# Patient Record
Sex: Female | Born: 1980 | Race: White | Hispanic: No | Marital: Married | Smoking: Never smoker
Health system: Southern US, Community
[De-identification: ages and names within clinical notes are randomized; demographics above are authoritative.]

## PROBLEM LIST (undated history)

## (undated) ENCOUNTER — Inpatient Hospital Stay (HOSPITAL_COMMUNITY): Payer: Self-pay

## (undated) DIAGNOSIS — F32A Depression, unspecified: Secondary | ICD-10-CM

## (undated) DIAGNOSIS — F329 Major depressive disorder, single episode, unspecified: Secondary | ICD-10-CM

## (undated) HISTORY — PX: NO PAST SURGERIES: SHX2092

---
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
## 1999-11-26 ENCOUNTER — Other Ambulatory Visit: Admission: RE | Admit: 1999-11-26 | Discharge: 1999-11-26 | Payer: Self-pay | Admitting: Obstetrics and Gynecology

## 2001-01-06 ENCOUNTER — Other Ambulatory Visit: Admission: RE | Admit: 2001-01-06 | Discharge: 2001-01-06 | Payer: Self-pay | Admitting: Obstetrics and Gynecology

## 2001-07-03 ENCOUNTER — Inpatient Hospital Stay (HOSPITAL_COMMUNITY): Admission: AD | Admit: 2001-07-03 | Discharge: 2001-07-03 | Payer: Self-pay | Admitting: Obstetrics and Gynecology

## 2001-11-17 ENCOUNTER — Ambulatory Visit (HOSPITAL_COMMUNITY): Admission: RE | Admit: 2001-11-17 | Discharge: 2001-11-17 | Payer: Self-pay | Admitting: Obstetrics and Gynecology

## 2002-01-18 ENCOUNTER — Inpatient Hospital Stay (HOSPITAL_COMMUNITY): Admission: AD | Admit: 2002-01-18 | Discharge: 2002-01-20 | Payer: Self-pay | Admitting: Obstetrics and Gynecology

## 2002-01-22 ENCOUNTER — Encounter: Admission: RE | Admit: 2002-01-22 | Discharge: 2002-02-21 | Payer: Self-pay | Admitting: Obstetrics and Gynecology

## 2002-02-15 ENCOUNTER — Other Ambulatory Visit: Admission: RE | Admit: 2002-02-15 | Discharge: 2002-02-15 | Payer: Self-pay | Admitting: Obstetrics and Gynecology

## 2002-03-24 ENCOUNTER — Encounter: Admission: RE | Admit: 2002-03-24 | Discharge: 2002-04-23 | Payer: Self-pay | Admitting: Obstetrics and Gynecology

## 2002-04-24 ENCOUNTER — Encounter: Admission: RE | Admit: 2002-04-24 | Discharge: 2002-05-24 | Payer: Self-pay | Admitting: Obstetrics and Gynecology

## 2003-05-16 ENCOUNTER — Other Ambulatory Visit: Admission: RE | Admit: 2003-05-16 | Discharge: 2003-05-16 | Payer: Self-pay | Admitting: Obstetrics and Gynecology

## 2004-05-25 ENCOUNTER — Ambulatory Visit: Payer: Self-pay | Admitting: Family Medicine

## 2004-09-01 ENCOUNTER — Other Ambulatory Visit: Admission: RE | Admit: 2004-09-01 | Discharge: 2004-09-01 | Payer: Self-pay | Admitting: Obstetrics and Gynecology

## 2004-11-12 ENCOUNTER — Inpatient Hospital Stay (HOSPITAL_COMMUNITY): Admission: AD | Admit: 2004-11-12 | Discharge: 2004-11-12 | Payer: Self-pay | Admitting: Obstetrics and Gynecology

## 2005-03-25 ENCOUNTER — Inpatient Hospital Stay (HOSPITAL_COMMUNITY): Admission: AD | Admit: 2005-03-25 | Discharge: 2005-03-25 | Payer: Self-pay | Admitting: Obstetrics and Gynecology

## 2005-06-17 ENCOUNTER — Inpatient Hospital Stay (HOSPITAL_COMMUNITY): Admission: AD | Admit: 2005-06-17 | Discharge: 2005-06-19 | Payer: Self-pay | Admitting: Obstetrics & Gynecology

## 2007-06-12 ENCOUNTER — Encounter: Admission: RE | Admit: 2007-06-12 | Discharge: 2007-06-12 | Payer: Self-pay | Admitting: Obstetrics and Gynecology

## 2008-10-30 ENCOUNTER — Encounter: Payer: Self-pay | Admitting: Family Medicine

## 2008-12-23 ENCOUNTER — Encounter: Admission: RE | Admit: 2008-12-23 | Discharge: 2008-12-23 | Payer: Self-pay | Admitting: Family Medicine

## 2009-08-25 IMAGING — CT CT HEAD W/O CM
2 series · 16 of 30 positions shown, 20 images · non-contrast
Comparison: NONE

CLINICAL DATA: Headache. 

UNENHANCED CT OF THE HEAD
TECHNIQUE: Axial 5 millimeter thick slices were obtained through 
the posterior fossa and 5 millimeter thick slices were obtained 
through the remaining portion of the head without intravenous 
contrast.

[Series 2: without contrast · axial · non-contrast · 0.49mm/px · z∈[-457,-307]mm · 13 of 36 slices shown, 17 images]
[im 3/36  brain]
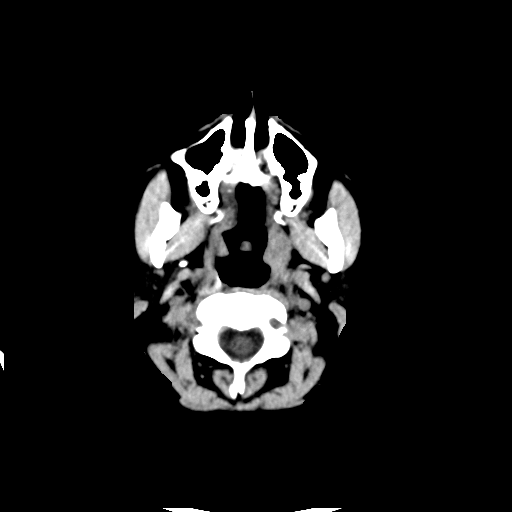
[im 3/36  bone]
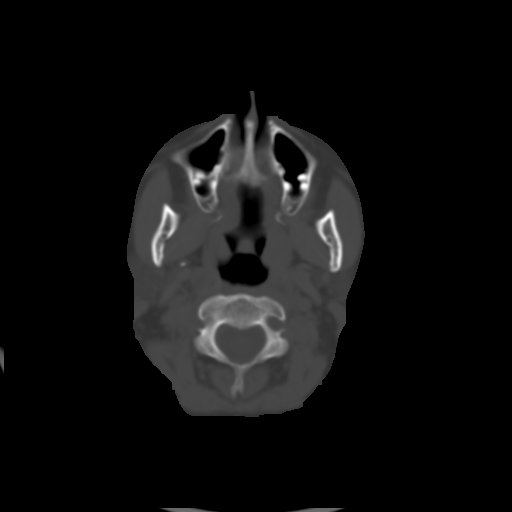
[im 6/36  brain]
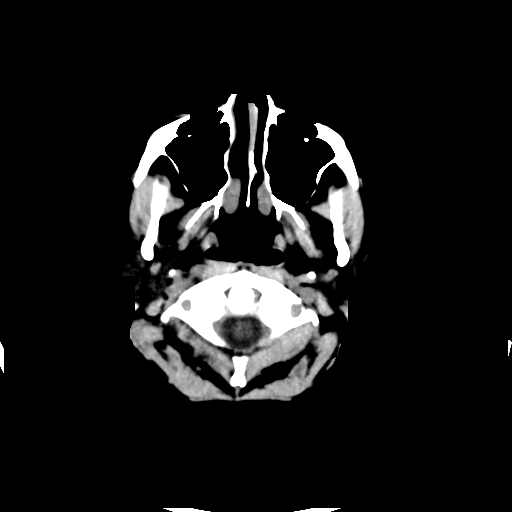
[im 8/36  brain]
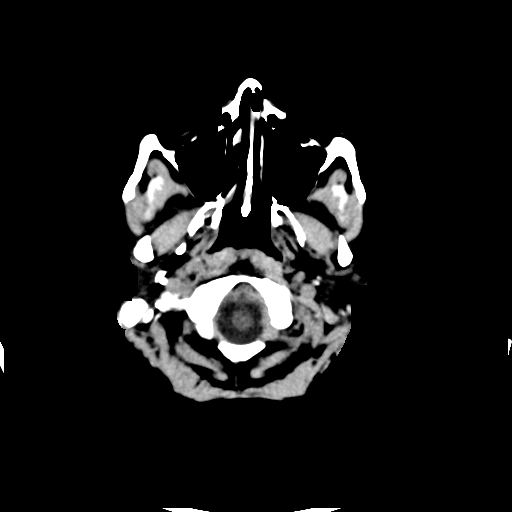
[im 11/36  brain]
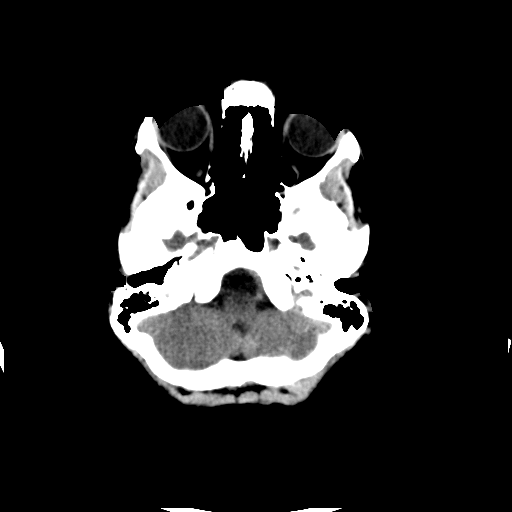
[im 13/36  brain]
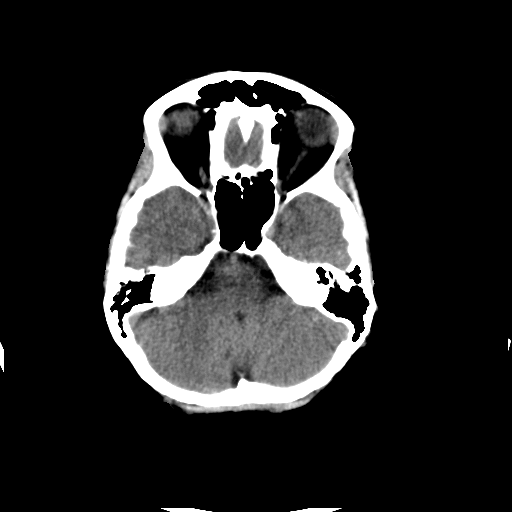
[im 13/36  bone]
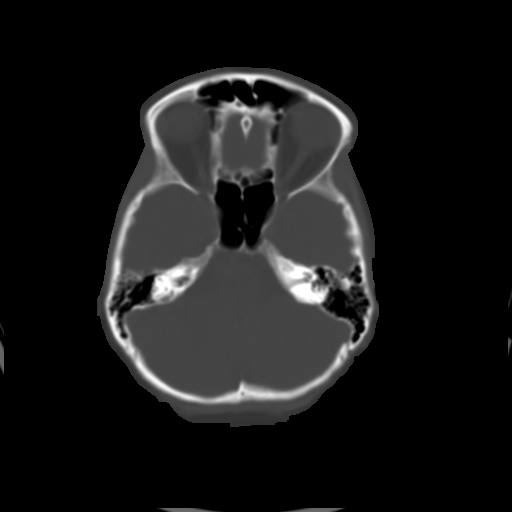
[im 16/36  brain]
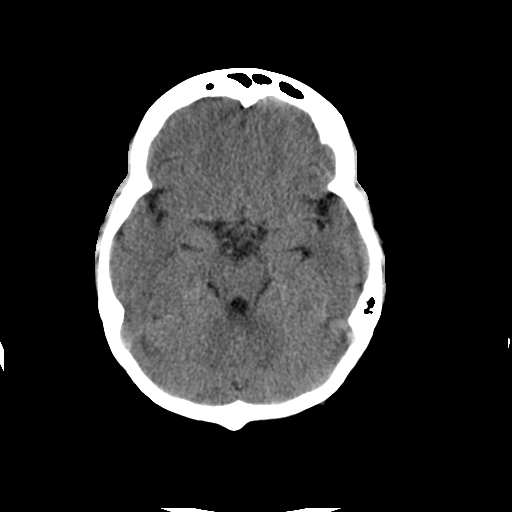
[im 18/36  brain]
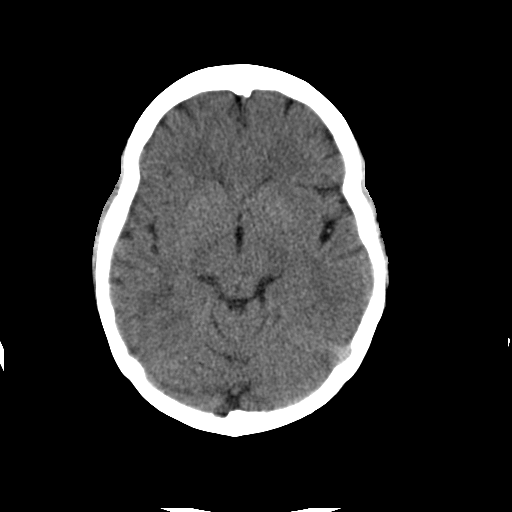
[im 21/36  brain]
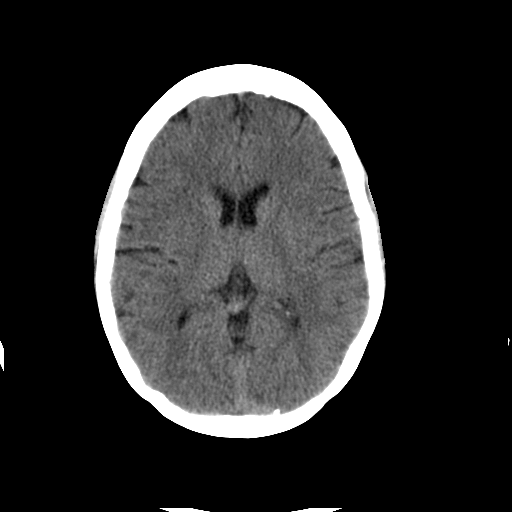
[im 23/36  brain]
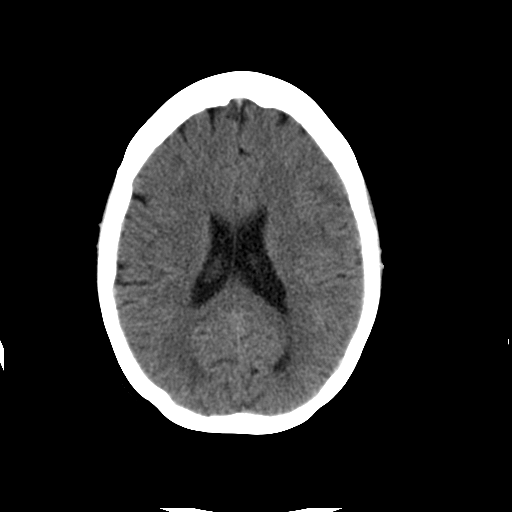
[im 23/36  bone]
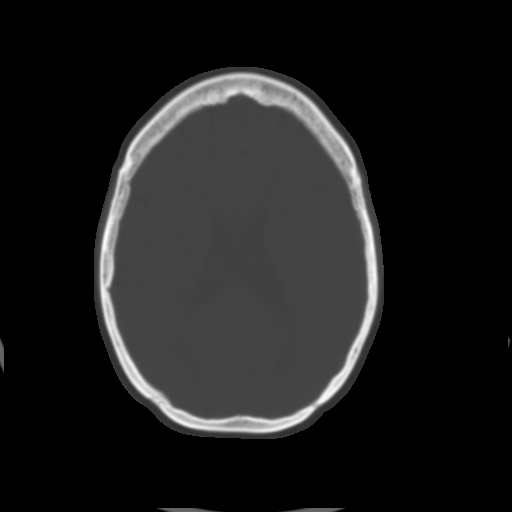
[im 26/36  brain]
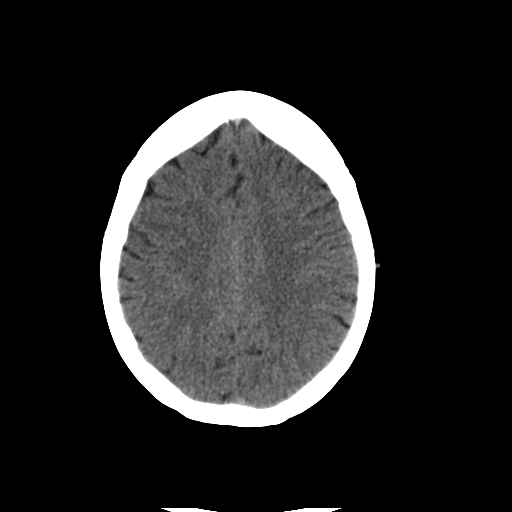
[im 28/36  brain]
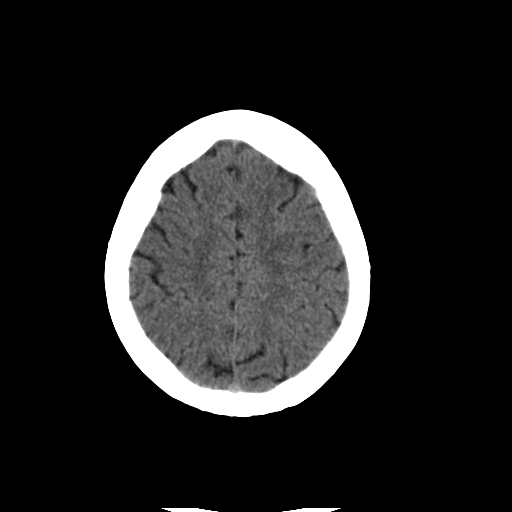
[im 31/36  brain]
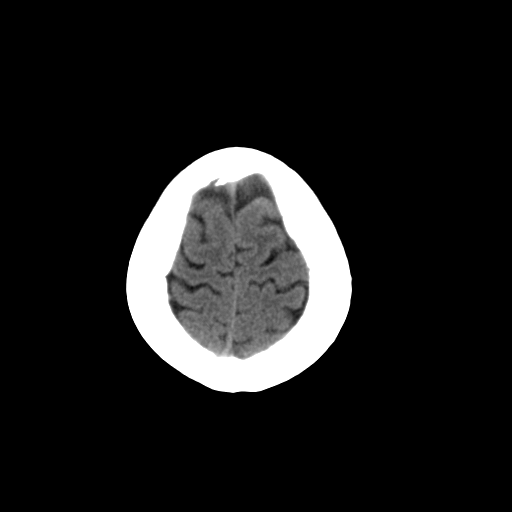
[im 33/36  brain]
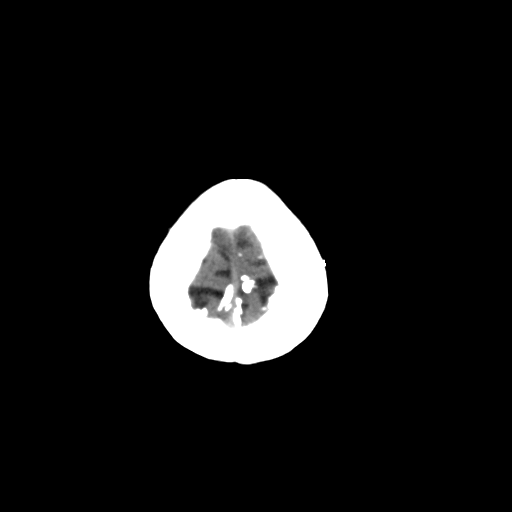
[im 33/36  bone]
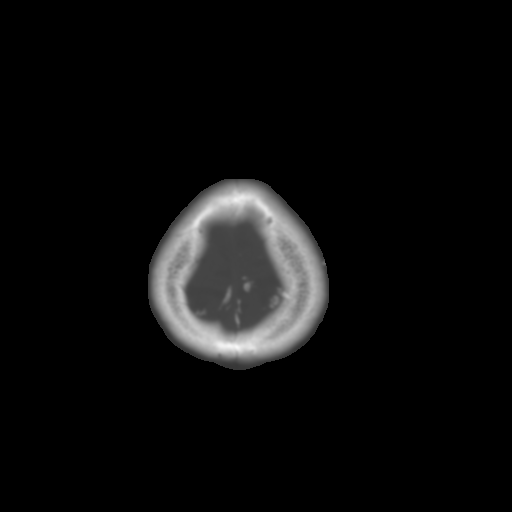

[Series 3: bone windows · axial · 0.49mm/px · z∈[-457,-407]mm · 3 of 36 slices shown]
[im 3/36  bone]
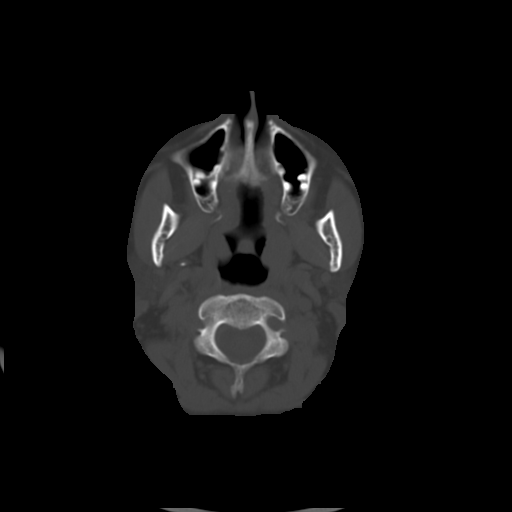
[im 8/36  bone]
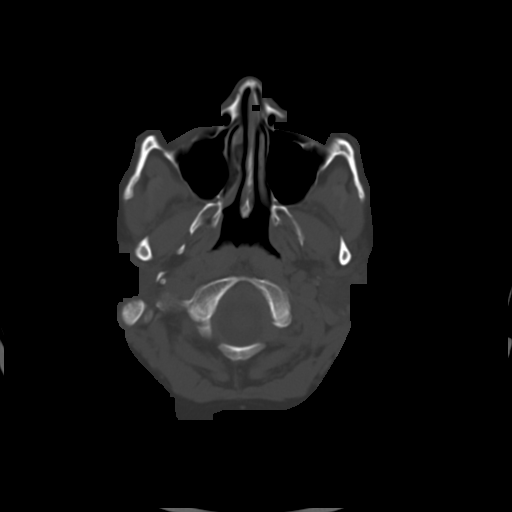
[im 13/36  bone]
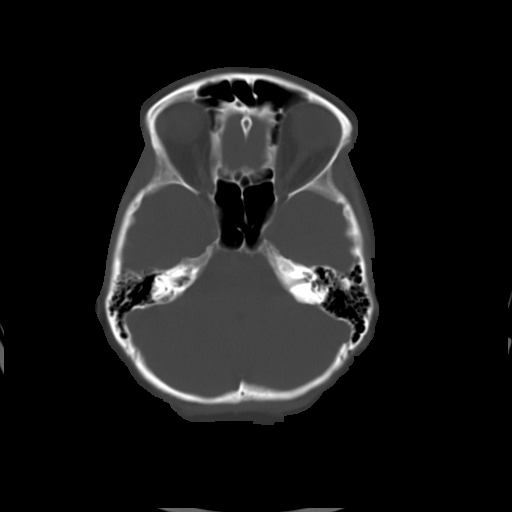

[16 of 30 positions shown; findings below may reference images not displayed]

FINDINGS: No prior. The visualized portions of the paranasal 
sinuses, orbits, and mastoids are unremarkable in appearance. The 
fourth, third and both lateral ventricles are identified.  There 
is no evidence of midline shift or mass effect infratentorially or 
supratentorially. No areas of abnormal radiolucency or 
radiodensity are identified. There is no evidence of subarachnoid 
or intracerebral hemorrhage.  There is no evidence of subdural or 
epidural hematoma. The calvarium is intact.
IMPRESSION: Normal unenhanced CT of the head. Zinuta Bau 
02/21/2008 Dict Date: 02/20/2008  Trans Date: 02/21/2008 [id number]  
[REDACTED]

## 2010-06-14 ENCOUNTER — Emergency Department (HOSPITAL_COMMUNITY)
Admission: EM | Admit: 2010-06-14 | Discharge: 2010-06-14 | Disposition: A | Discharge status: Home / Self Care | Payer: BLUE CROSS/BLUE SHIELD | Attending: Emergency Medicine | Admitting: Emergency Medicine

## 2010-06-14 DIAGNOSIS — R197 Diarrhea, unspecified: Secondary | ICD-10-CM | POA: Insufficient documentation

## 2010-06-14 DIAGNOSIS — IMO0001 Reserved for inherently not codable concepts without codable children: Secondary | ICD-10-CM | POA: Insufficient documentation

## 2010-06-14 DIAGNOSIS — R209 Unspecified disturbances of skin sensation: Secondary | ICD-10-CM | POA: Insufficient documentation

## 2010-06-14 DIAGNOSIS — B9789 Other viral agents as the cause of diseases classified elsewhere: Secondary | ICD-10-CM | POA: Insufficient documentation

## 2010-06-14 DIAGNOSIS — R112 Nausea with vomiting, unspecified: Secondary | ICD-10-CM | POA: Insufficient documentation

## 2010-06-14 LAB — POCT I-STAT, CHEM 8
BUN: 10 mg/dL (ref 6–23)
Calcium, Ion: 1.12 mmol/L (ref 1.12–1.32)
Creatinine, Ser: 0.8 mg/dL (ref 0.4–1.2)
Hemoglobin: 15.6 g/dL — ABNORMAL HIGH (ref 12.0–15.0)
Potassium: 3.5 mEq/L (ref 3.5–5.1)
Sodium: 141 mEq/L (ref 135–145)

## 2010-06-28 IMAGING — US US TRANSVAGINAL NON-OB
1 series · 14 of 25 positions shown · non-contrast
Comparison: Abdominal ultrasound same date.

CLINICAL DATA: Pelvic pain.

TRANSABDOMINAL AND TRANSVAGINAL ULTRASOUND OF PELVIS
TECHNIQUE: Both transabdominal and transvaginal ultrasound
examinations of the pelvis were performed including evaluation of
the uterus, ovaries, adnexal regions, and pelvic cul-de-sac.

[Series 1: us transvaginal non-ob · 0.22mm/px · 14 of 74 slices shown]
[im 1/74]
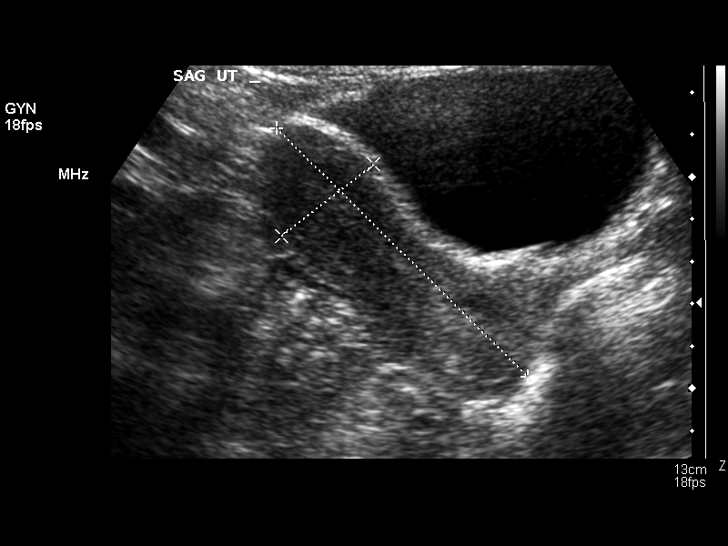
[im 7/74]
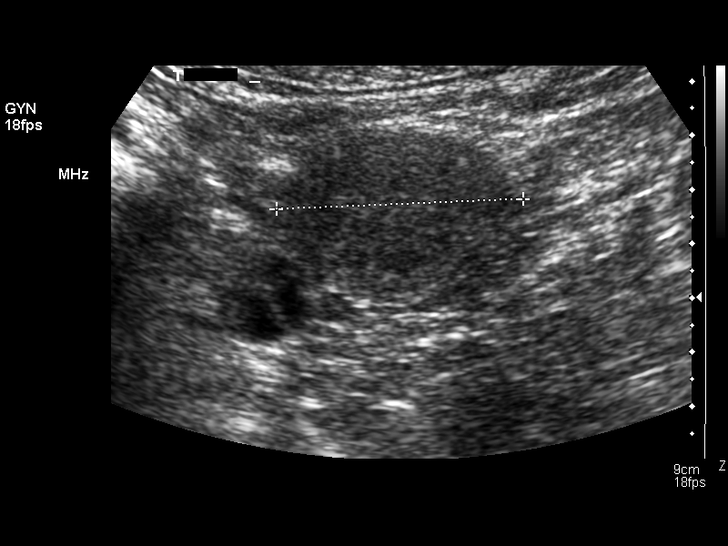
[im 13/74]
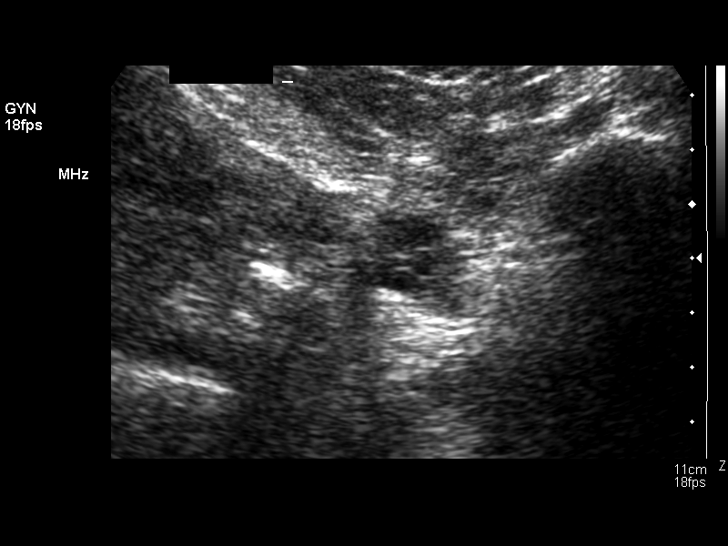
[im 19/74]
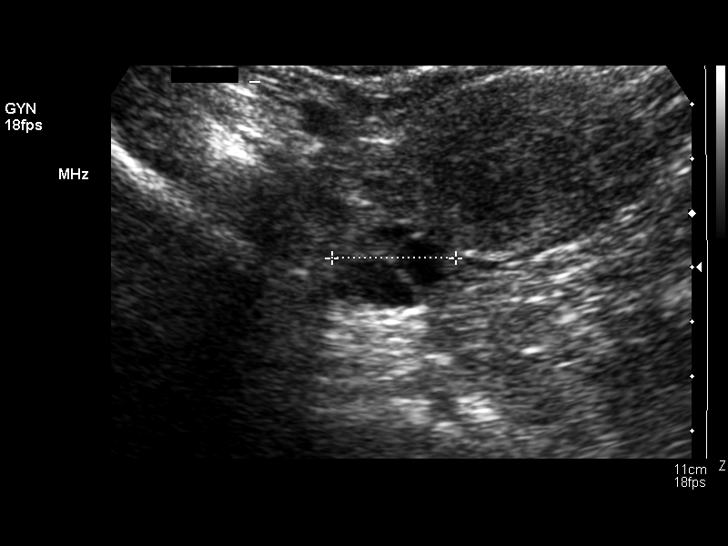
[im 25/74]
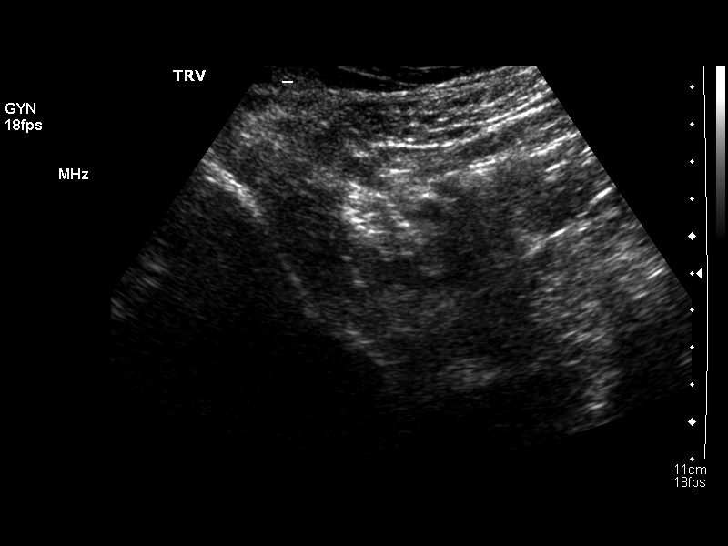
[im 28/74]
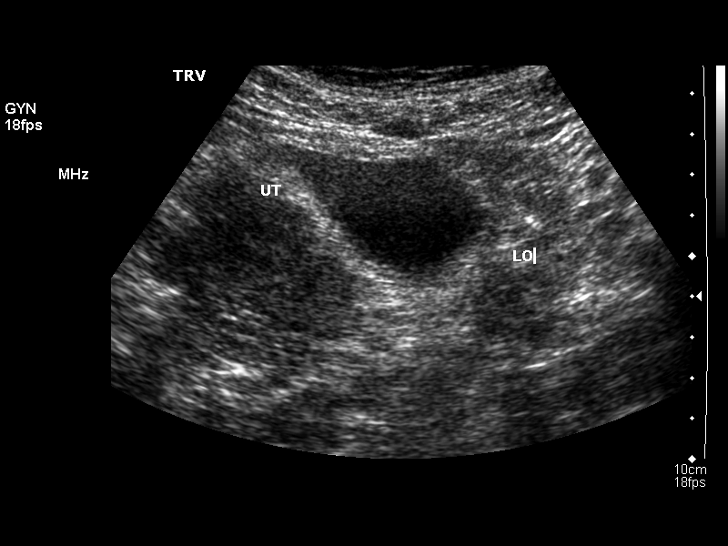
[im 34/74]
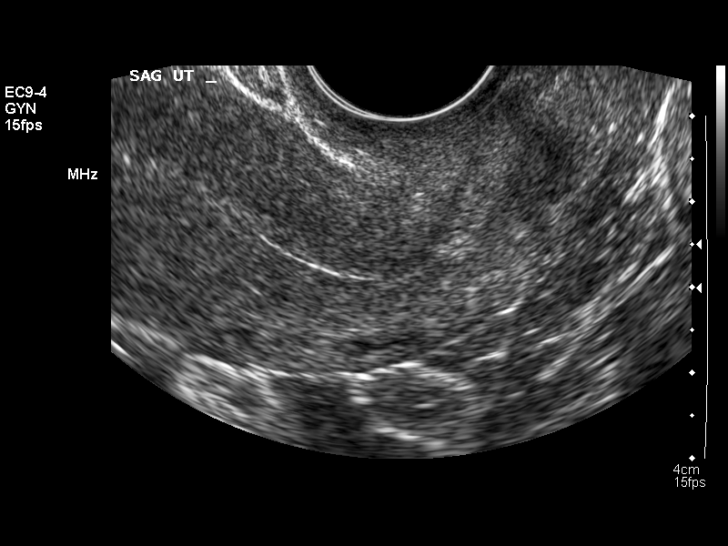
[im 40/74]
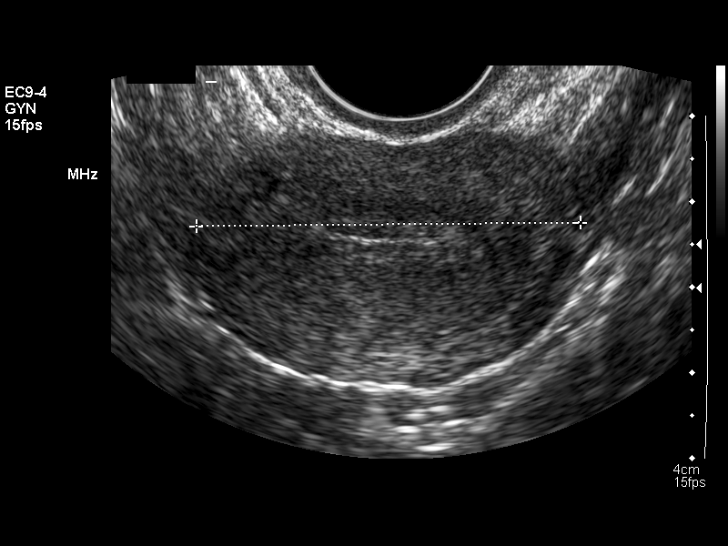
[im 46/74]
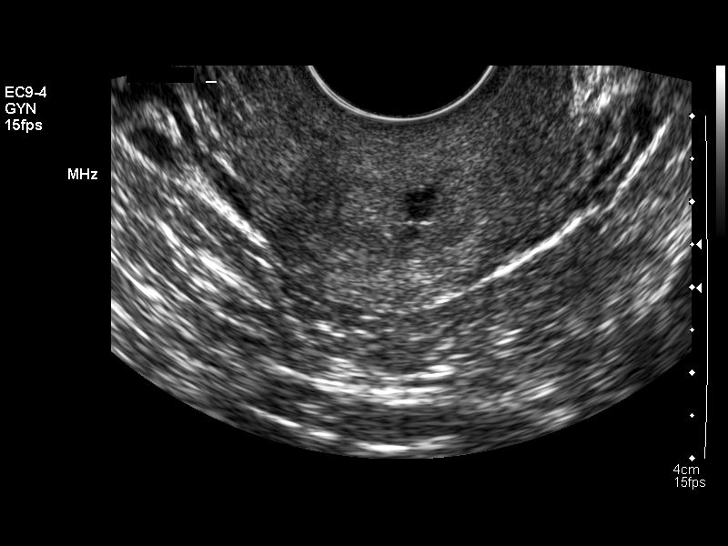
[im 49/74]
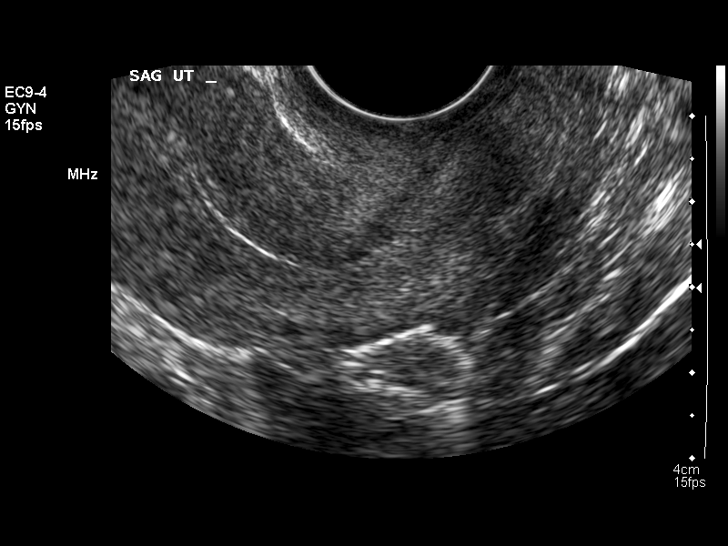
[im 55/74]
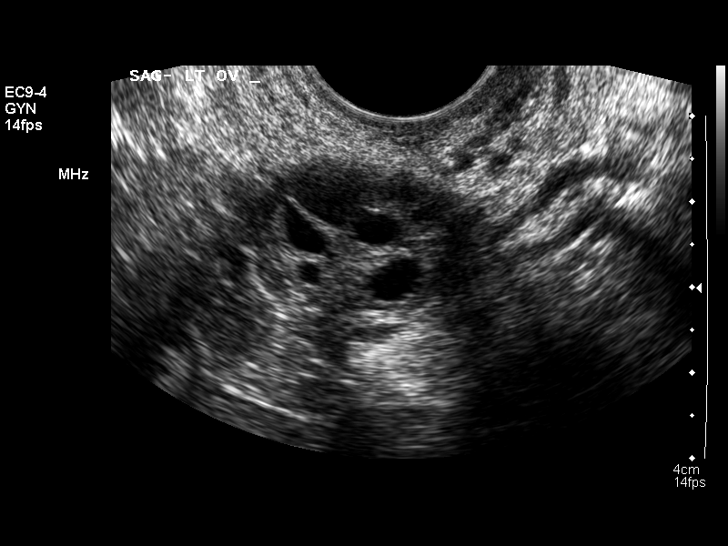
[im 61/74]
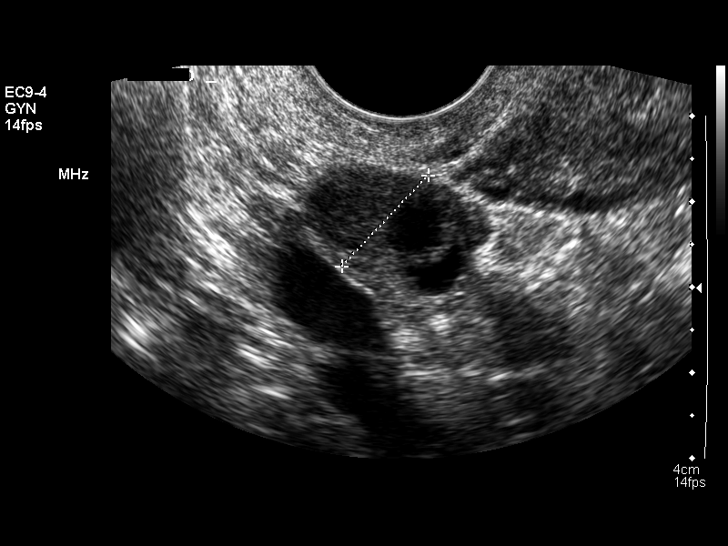
[im 67/74]
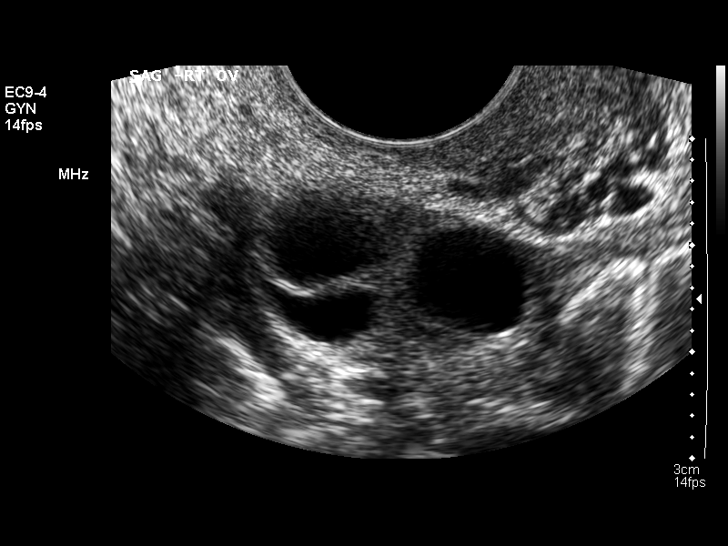
[im 74/74]
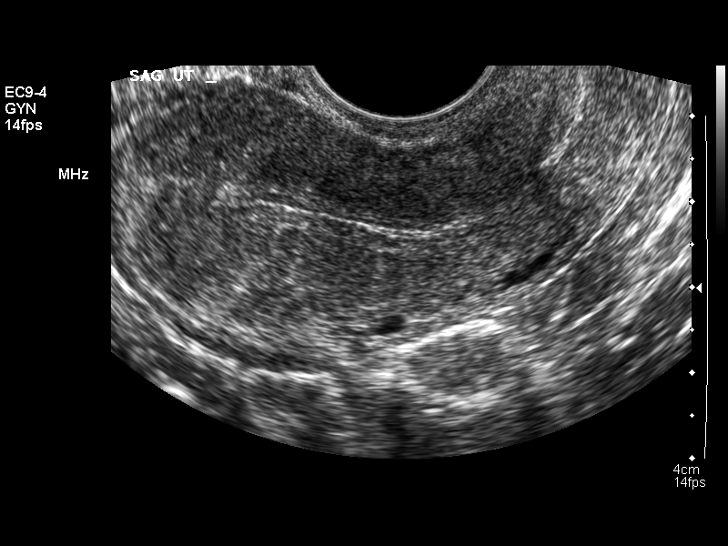

[14 of 25 positions shown; findings below may reference images not displayed]

FINDINGS: Uterus 7.9 x 3.4 x 4.5 cm.  Small Nabothian cysts.

Endometrium normal, 6 mm

Right Ovary 3.3 x 1.8 x 1.5 cm.  Mildly prominent ovarian follicles
which measure up to 1.1 cm.

Left Ovary 2.6 x 1.5 x 1.7 cm.  Normal left ovarian morphology.

Other Findings:  No significant free fluid.
IMPRESSION: 1.  Mildly prominent right ovarian follicles, felt to be within
normal limits.
2.  Otherwise, normal pelvic ultrasound for age.

## 2012-10-27 ENCOUNTER — Encounter: Payer: Self-pay | Admitting: Family Medicine

## 2012-10-27 ENCOUNTER — Ambulatory Visit (INDEPENDENT_AMBULATORY_CARE_PROVIDER_SITE_OTHER): Payer: BLUE CROSS/BLUE SHIELD | Admitting: Family Medicine

## 2012-10-27 VITALS — BP 100/76 | HR 68 | Temp 97.6°F | Resp 12 | Ht 63.0 in | Wt 138.0 lb

## 2012-10-27 DIAGNOSIS — Z0000 Encounter for general adult medical examination without abnormal findings: Secondary | ICD-10-CM

## 2012-10-27 MED ORDER — DIAZEPAM 10 MG PO TABS: 10.0000 mg | ORAL_TABLET | Freq: Three times a day (TID) | ORAL | Status: DC | PRN

## 2012-10-27 NOTE — Progress Notes (Signed)
Subjective:    Patient ID: Karla Mcintosh, female    DOB: May 19, 1980, 32 y.o.   MRN: [id number]  HPI Very pleasant 32 year old female here today for a physical. She sees a gynecologist who performs her breast exams, Pap smears, pelvic exam. This is scheduled for next month. She is going to be running a half marathon in [place] in September.  She is afraid of flying and is requesting medicine to help her relax on airplane. Otherwise she has no specific medical complaints. Previously she had a mildly elevated white blood cell count last year and we need to follow that up. She denies any fevers chills myalgias or rigors. No past medical history on file. No current outpatient prescriptions on file prior to visit.   No current facility-administered medications on file prior to visit.   No Known Allergies History   Social History  . Marital Status: Married    Spouse Name: N/A    Number of Children: N/A  . Years of Education: N/A   Occupational History  . Not on file.   Social History Main Topics  . Smoking status: Never Smoker   . Smokeless tobacco: Never Used  . Alcohol Use: Yes     Comment: Very rare  . Drug Use: No  . Sexual Activity: Yes     Comment: Married to [place], 2 kids, running 1/2 marathon in [place] Sept/14   Other Topics Concern  . Not on file   Social History Narrative  . No narrative on file   Family History  Problem Relation Age of Onset  . Diabetes Father   . Cancer Maternal Grandmother     thyroid cancer  . Arthritis Paternal Grandmother     rheumatoid  . Heart disease Paternal Grandfather       Review of Systems  All other systems reviewed and are negative.       Objective:   Physical Exam  Vitals reviewed. Constitutional: She is oriented to person, place, and time. She appears well-developed and well-nourished. No distress.  HENT:  Head: Normocephalic and atraumatic.  Right Ear: External ear normal.  Left Ear: External ear normal.   Nose: Nose normal.  Mouth/Throat: Oropharynx is clear and moist. No oropharyngeal exudate.  Eyes: Conjunctivae and EOM are normal. Pupils are equal, round, and reactive to light. Right eye exhibits no discharge. Left eye exhibits no discharge. No scleral icterus.  Neck: Normal range of motion. Neck supple. No JVD present. No tracheal deviation present. No thyromegaly present.  Cardiovascular: Normal rate, regular rhythm, normal heart sounds and intact distal pulses.  Exam reveals no gallop and no friction rub.   No murmur heard. Pulmonary/Chest: Effort normal and breath sounds normal. No stridor. No respiratory distress. She has no wheezes. She has no rales. She exhibits no tenderness.  Abdominal: Soft. Bowel sounds are normal. She exhibits no distension and no mass. There is no tenderness. There is no rebound and no guarding.  Musculoskeletal: Normal range of motion. She exhibits no edema and no tenderness.  Lymphadenopathy:    She has no cervical adenopathy.  Neurological: She is alert and oriented to person, place, and time. She has normal reflexes. She displays normal reflexes. No cranial nerve deficit. She exhibits normal muscle tone. Coordination normal.  Skin: Skin is warm. No rash noted. She is not diaphoretic. No erythema. No pallor.  Psychiatric: She has a normal mood and affect. Her behavior is normal. Judgment and thought content normal.  Assessment & Plan:  1. Routine general medical examination at a health care facility Her physical exam today is completely normal. I recommended she return fasting next week so that we can check a CBC, CMP, lipid panel, and thyroid. I recommended an annual flu shot in the fall. Her TdaP shot was given in 2012 and is up-to-date.  I gave the patient diazepam 10 mg tablets one half of the tablet 30 minutes prior to flying. She could take the other half hours into the flight if necessary. I warned her about sedation and side effects of  diazepam. - CBC with Differential; Future - COMPLETE METABOLIC PANEL WITH GFR; Future - Lipid panel; Future - TSH; Future

## 2013-03-12 ENCOUNTER — Telehealth: Payer: Self-pay | Admitting: *Deleted

## 2013-03-12 MED ORDER — CIPROFLOXACIN HCL 500 MG PO TABS: 500.0000 mg | ORAL_TABLET | Freq: Two times a day (BID) | ORAL | Status: DC

## 2013-03-12 NOTE — Telephone Encounter (Signed)
Med refilled and pt aware  

## 2013-03-12 NOTE — Telephone Encounter (Signed)
[facility] not [facility] but she can have cipro 500 bid for 5 days.

## 2013-03-12 NOTE — Telephone Encounter (Signed)
Pt states [facility] went to [facility] with [facility]TI [facility], it was confirmed UTI, was given antibiotic for 3 days on last dose today, states she is still having symptoms and wants to know if you can give another round of antibiotic, pt could not give me name of medication but states that [facility] was going to send something to you by fax.

## 2013-03-13 ENCOUNTER — Telehealth: Payer: Self-pay | Admitting: *Deleted

## 2013-03-13 MED ORDER — SULFAMETHOXAZOLE-TMP DS 800-160 MG PO TABS: 1.0000 | ORAL_TABLET | Freq: Two times a day (BID) | ORAL | Status: DC

## 2013-03-13 NOTE — Telephone Encounter (Signed)
Was given cipro last nite pt read side effects and is concerned, she is running in a marathon this week and she read that cipro can cause tendon rupture and she feels she should not take this medication, is there something else she can take in its place.

## 2013-03-13 NOTE — Telephone Encounter (Signed)
Message copied by Donne AnonPLUMMER, KIM M on Tue Mar 13, 2013  5:16 PM ------      Message from: Eustaquio MaizeMEDLIN, SARAH J      Created: Tue Mar 13, 2013  2:07 PM      Contact: [phone]       lEFT MESSAGE FOR NURSE TO CALL HER ------

## 2013-03-13 NOTE — Telephone Encounter (Signed)
I think she would be fine on cipro but she can switch to bactrim ds pobid for 3 days if she wishes.

## 2013-03-13 NOTE — Telephone Encounter (Signed)
Pt called and new RX sent to pharmacy

## 2013-05-31 ENCOUNTER — Encounter: Payer: Self-pay | Admitting: Family Medicine

## 2013-05-31 ENCOUNTER — Ambulatory Visit (INDEPENDENT_AMBULATORY_CARE_PROVIDER_SITE_OTHER): Payer: BC Managed Care – PPO | Admitting: Family Medicine

## 2013-05-31 VITALS — BP 110/68 | HR 60 | Temp 97.4°F | Resp 16 | Ht 63.0 in | Wt 147.0 lb

## 2013-05-31 DIAGNOSIS — E669 Obesity, unspecified: Secondary | ICD-10-CM

## 2013-05-31 MED ORDER — PHENTERMINE HCL 37.5 MG PO CAPS: 37.5000 mg | ORAL_CAPSULE | ORAL | Status: DC

## 2013-05-31 NOTE — Progress Notes (Signed)
Subjective:    Patient ID: Karla Mcintosh, female    DOB: June 03, 1980, 33 y.o.   MRN: [id number]  HPI Patient is a very pleasant 33 year old white female who is concerned about her inability to lose weight. She weighs 147 pounds and is 5 foot 3 inches tall. This gives her a BMI of 26 which is slightly overweight. Her goal rate is 130 pounds. She is running approximately 30 minutes to an hour 5 days a week. She is very satisfied with her strength in her legs and of her legs. She is concerned because she is retaining weight in her mid section in her arms and in her chest. 2 placentae she has excessive cellulite in her triceps area, and her chest and upper back, and especially her midsection.  At present she is not performing any core exercises to address these areas. She is also eating a diet low in carbohydrates high in protein and vegetables. She denies any fatigue, abnormal hair growth, heat or cold intolerance, palpitations, diarrhea, libido, or other signs of endocrinologic problems. No past medical history on file. No current outpatient prescriptions on file prior to visit.   No current facility-administered medications on file prior to visit.   No Known Allergies History   Social History  . Marital Status: Married    Spouse Name: N/A    Number of Children: N/A  . Years of Education: N/A   Occupational History  . Not on file.   Social History Main Topics  . Smoking status: Never Smoker   . Smokeless tobacco: Never Used  . Alcohol Use: Yes     Comment: Very rare  . Drug Use: No  . Sexual Activity: Yes     Comment: Married to [place], 2 kids, running 1/2 marathon in [place] Sept/14   Other Topics Concern  . Not on file   Social History Narrative  . No narrative on file      Review of Systems  All other systems reviewed and are negative.       Objective:   Physical Exam  Vitals reviewed. Constitutional: She appears well-developed and well-nourished.  Neck: Neck  supple. No thyromegaly present.  Cardiovascular: Normal rate, regular rhythm and normal heart sounds.   No murmur heard. Pulmonary/Chest: Effort normal and breath sounds normal. No respiratory distress. She has no wheezes. She has no rales.  Musculoskeletal: She exhibits no edema.  Lymphadenopathy:    She has no cervical adenopathy.   patient does have extra fat in her midsection and in the backs of her arms.        Assessment & Plan:  1. Obesity, unspecified Patient does have elevated BMI. However I do not believe she is obese. Counseled patient against weight loss drugs. I do believe it is reasonable to work towards a target weight of 130-135 pounds. However I explained to the patient that the majority of our focus needs to be on exercises that address her problem areas. I recommended high repetition activities at low weight such as pushups, crunches, sit ups, curls.  I recommended that she spend less time exercising running and more on cardio and upper body strengthening. I will give the patient one-month supply of Adipex 37.5 mg by mouth every morning to help control some of her cravings and help reduce calorie intake. Also recommended switching to a high-protein diet and low carbohydrate diet. Recheck in one month. - phentermine 37.5 MG capsule; Take 1 capsule (37.5 mg total) by mouth every morning.  Dispense: 30 capsule; Refill: 0

## 2013-08-01 ENCOUNTER — Telehealth: Payer: Self-pay | Admitting: Family Medicine

## 2013-08-01 DIAGNOSIS — E669 Obesity, unspecified: Secondary | ICD-10-CM

## 2013-08-01 NOTE — Telephone Encounter (Signed)
Message copied by Ricard Dillon on Wed Aug 01, 2013  2:28 PM ------      Message from: Karla Mcintosh      Created: Tue Jul 31, 2013  4:38 PM       Patient would like to speak with you left message and did not specify the reason [phone] ------

## 2013-08-01 NOTE — Telephone Encounter (Signed)
Pt would like one more month of Phentermine as she is doing well on it and would like it for just one more month.

## 2013-08-02 MED ORDER — PHENTERMINE HCL 37.5 MG PO CAPS: 37.5000 mg | ORAL_CAPSULE | ORAL | Status: DC

## 2013-08-02 NOTE — Telephone Encounter (Signed)
Prescription printed and patient made aware.   Prescription faxed.

## 2013-08-02 NOTE — Telephone Encounter (Signed)
Ok 37.5 poqam 30 0 rf

## 2013-11-07 ENCOUNTER — Telehealth: Payer: Self-pay | Admitting: Family Medicine

## 2013-11-07 NOTE — Telephone Encounter (Signed)
[phone]   Pt is wanting to know when her last TDap was and how long are they good for??

## 2013-11-08 NOTE — Telephone Encounter (Signed)
Pt aware of date and length of time for Tetanus

## 2014-03-08 NOTE — L&D Delivery Note (Addendum)
Delivery Note  First Stage: Labor onset: 0300 Augmentation: AROM, Pitocin Analgesia /Anesthesia intrapartum: epidural AROM at 1132  Second Stage: Complete dilation at 1723 Onset of pushing at 1723 FHR second stage 170-180s, variables w/pushing, Cat II Maternal temp 101, Tylenol 1 gm po and Gentamicin 2mg /kg given  In maternal lithotomy, delivery of a viable female at [id number] by CNM in OA position with restitution to ROT No nuchal cord Cord double clamped after cessation of pulsation, cut by FOB Cord blood sample collected   Collection of cord blood donation n/a Arterial cord blood sample n/a  Third Stage: Placenta delivered via Tomasa BlaseSchultz intact with 3 VC @ 1856 Placenta disposition: pathology Uterine tone firm / bleeding small  2nd degree perineal laceration identified  Anesthesia for repair: regional Repaired with 2-0 Vicryl rapide Est. Blood Loss (mL): 125  Complications: fetal tachycardia, maternal fever  Mom to postpartum.  Baby to Couplet care / Skin to Skin.  Newborn: Birth Weight: pending Apgar Scores: 7/9 Feeding planned: breast  [name], Karla Mcintosh, N MSN, CNM 01/26/2015, 7:12 PM

## 2014-04-06 ENCOUNTER — Inpatient Hospital Stay (HOSPITAL_COMMUNITY)
Admission: AD | Admit: 2014-04-06 | Discharge: 2014-04-06 | Disposition: A | Discharge status: Home / Self Care | Payer: BLUE CROSS/BLUE SHIELD | Source: Ambulatory Visit | Attending: Obstetrics & Gynecology | Admitting: Obstetrics & Gynecology

## 2014-04-06 ENCOUNTER — Encounter (HOSPITAL_COMMUNITY): Payer: Self-pay | Admitting: *Deleted

## 2014-04-06 DIAGNOSIS — O360110 Maternal care for anti-D [Rh] antibodies, first trimester, not applicable or unspecified: Secondary | ICD-10-CM

## 2014-04-06 DIAGNOSIS — O209 Hemorrhage in early pregnancy, unspecified: Secondary | ICD-10-CM | POA: Insufficient documentation

## 2014-04-06 DIAGNOSIS — Z3A01 Less than 8 weeks gestation of pregnancy: Secondary | ICD-10-CM | POA: Diagnosis not present

## 2014-04-06 DIAGNOSIS — O360910 Maternal care for other rhesus isoimmunization, first trimester, not applicable or unspecified: Secondary | ICD-10-CM | POA: Insufficient documentation

## 2014-04-06 LAB — WET PREP, GENITAL
CLUE CELLS WET PREP: NONE SEEN
Trich, Wet Prep: NONE SEEN
Yeast Wet Prep HPF POC: NONE SEEN

## 2014-04-06 LAB — URINALYSIS, ROUTINE W REFLEX MICROSCOPIC
BILIRUBIN URINE: NEGATIVE
Glucose, UA: NEGATIVE mg/dL
KETONES UR: NEGATIVE mg/dL
Leukocytes, UA: NEGATIVE
Nitrite: NEGATIVE
PH: 5.5 (ref 5.0–8.0)
Protein, ur: NEGATIVE mg/dL
Specific Gravity, Urine: >1.03 — ABNORMAL HIGH (ref 1.005–1.030)
Urobilinogen, UA: 0.2 mg/dL (ref 0.0–1.0)

## 2014-04-06 LAB — CBC
HCT: 39.6 % (ref 36.0–46.0)
Hemoglobin: 14.2 g/dL (ref 12.0–15.0)
MCH: 33.3 pg (ref 26.0–34.0)
MCHC: 35.9 g/dL (ref 30.0–36.0)
MCV: 93 fL (ref 78.0–100.0)
PLATELETS: 277 10*3/uL (ref 150–400)
RBC: 4.26 MIL/uL (ref 3.87–5.11)
RDW: 12.4 % (ref 11.5–15.5)
WBC: 7.9 10*3/uL (ref 4.0–10.5)

## 2014-04-06 LAB — URINE MICROSCOPIC-ADD ON

## 2014-04-06 LAB — HCG, QUANTITATIVE, PREGNANCY: HCG, BETA CHAIN, QUANT, S: 20 m[IU]/mL — AB (ref <5)

## 2014-04-06 LAB — ABO/RH: ABO/RH(D): O NEG

## 2014-04-06 MED ORDER — RHO D IMMUNE GLOBULIN 1500 UNIT/2ML IJ SOSY
300.0000 ug | PREFILLED_SYRINGE | Freq: Once | INTRAMUSCULAR | Status: AC
  Administered 2014-04-06: 300 ug via INTRAMUSCULAR
  Filled 2014-04-06: qty 2

## 2014-04-06 NOTE — MAU Note (Signed)
Pt states LMP-03/04/2014, here for vaginal bleeding. Has been spotting intermittently, however after having bowel mvmt this am, stood up and passed dark red blood clot. Very mild intermittent cramping.

## 2014-04-06 NOTE — Discharge Instructions (Signed)
Vaginal Bleeding During Pregnancy, First Trimester °A small amount of bleeding (spotting) from the vagina is relatively common in early pregnancy. It usually stops on its own. Various things may cause bleeding or spotting in early pregnancy. Some bleeding may be related to the pregnancy, and some may not. In most cases, the bleeding is normal and is not a problem. However, bleeding can also be a sign of something serious. Be sure to tell your health care provider about any vaginal bleeding right away. °Some possible causes of vaginal bleeding during the first trimester include: °· Infection or inflammation of the cervix. °· Growths (polyps) on the cervix. °· Miscarriage or threatened miscarriage. °· Pregnancy tissue has developed outside of the uterus and in a fallopian tube (tubal pregnancy). °· Tiny cysts have developed in the uterus instead of pregnancy tissue (molar pregnancy). °HOME CARE INSTRUCTIONS  °Watch your condition for any changes. The following actions may help to lessen any discomfort you are feeling: °· Follow your health care provider's instructions for limiting your activity. If your health care provider orders bed rest, you may need to stay in bed and only get up to use the bathroom. However, your health care provider may allow you to continue light activity. °· If needed, make plans for someone to help with your regular activities and responsibilities while you are on bed rest. °· Keep track of the number of pads you use each day, how often you change pads, and how soaked (saturated) they are. Write this down. °· Do not use tampons. Do not douche. °· Do not have sexual intercourse or orgasms until approved by your health care provider. °· If you pass any tissue from your vagina, save the tissue so you can show it to your health care provider. °· Only take over-the-counter or prescription medicines as directed by your health care provider. °· Do not take aspirin because it can make you  bleed. °· Keep all follow-up appointments as directed by your health care provider. °SEEK MEDICAL CARE IF: °· You have any vaginal bleeding during any part of your pregnancy. °· You have cramps or labor pains. °· You have a fever, not controlled by medicine. °SEEK IMMEDIATE MEDICAL CARE IF:  °· You have severe cramps in your back or belly (abdomen). °· You pass large clots or tissue from your vagina. °· Your bleeding increases. °· You feel light-headed or weak, or you have fainting episodes. °· You have chills. °· You are leaking fluid or have a gush of fluid from your vagina. °· You pass out while having a bowel movement. °MAKE SURE YOU: °· Understand these instructions. °· Will watch your condition. °· Will get help right away if you are not doing well or get worse. °Document Released: 12/02/2004 Document Revised: 02/27/2013 Document Reviewed: 10/30/2012 °[employer]® Patient Information ©2015 [employer]. This information is not intended to replace advice given to you by your health care provider. Make sure you discuss any questions you have with your health care provider. ° °Pelvic Rest °Pelvic rest is sometimes recommended for women when:  °· The placenta is partially or completely covering the opening of the cervix (placenta previa). °· There is bleeding between the uterine wall and the amniotic sac in the first trimester (subchorionic hemorrhage). °· The cervix begins to open without labor starting (incompetent cervix, cervical insufficiency). °· The labor is too early (preterm labor). °HOME CARE INSTRUCTIONS °· Do not have sexual intercourse, stimulation, or an orgasm. °· Do not use tampons, douche, or   put anything in the vagina. °· Do not lift anything over 10 pounds (4.5 kg). °· Avoid strenuous activity or straining your pelvic muscles. °SEEK MEDICAL CARE IF:  °· You have any vaginal bleeding during pregnancy. Treat this as a potential emergency. °· You have cramping pain felt low in the stomach (stronger  than menstrual cramps). °· You notice vaginal discharge (watery, mucus, or bloody). °· You have a low, dull backache. °· There are regular contractions or uterine tightening. °SEEK IMMEDIATE MEDICAL CARE IF: °You have vaginal bleeding and have placenta previa.  °Document Released: 06/19/2010 Document Revised: 05/17/2011 Document Reviewed: 06/19/2010 °[employer]® Patient Information ©2015 [employer]. This information is not intended to replace advice given to you by your health care provider. Make sure you discuss any questions you have with your health care provider. ° ° °

## 2014-04-06 NOTE — MAU Provider Note (Signed)
Chief Complaint: No chief complaint on file.   First Provider Initiated Contact with Patient 04/06/14 1306     SUBJECTIVE HPI: Karla Mcintosh is a 34 y.o. G3P2002 at 4.5 weeks by LMP who presents with light vaginal bleeding since yesterday, passing a clot today and few episodes mild cramping. Pos home UPT x 3. Denies fever, chills, passage of tissue, urinary complaints, GI complaints.    History reviewed. No pertinent past medical history. OB History  Gravida Para Term Preterm AB SAB TAB Ectopic Multiple Living  3 2 2  0 0 0 0 0 0 2    # Outcome Date GA Lbr Len/2nd Weight Sex Delivery Anes PTL Lv  3 Current           2 Term 06/17/05 3462w0d  3.204 kg (7 lb 1 oz) M Vag-Spont EPI  Y  1 Term 01/18/02 8102w0d  3.204 kg (7 lb 1 oz) F Vag-Spont EPI  Y     History reviewed. No pertinent past surgical history. History   Social History  . Marital Status: Married    Spouse Name: N/A    Number of Children: N/A  . Years of Education: N/A   Occupational History  . Not on file.   Social History Main Topics  . Smoking status: Never Smoker   . Smokeless tobacco: Never Used  . Alcohol Use: Yes     Comment: Very rare  . Drug Use: No  . Sexual Activity: Yes     Comment: Married to AlvordKen, 2 kids, running 1/2 marathon in [place] Sept/14   Other Topics Concern  . Not on file   Social History Narrative   No current facility-administered medications on file prior to encounter.   Current Outpatient Prescriptions on File Prior to Encounter  Medication Sig Dispense Refill  . phentermine 37.5 MG capsule Take 1 capsule (37.5 mg total) by mouth every morning. (Patient not taking: Reported on 04/06/2014) 30 capsule 0   No Known Allergies  ROS: Pertinent positive items in HPI. Negative for fever, chills, passage of tissue, urinary complaints, GI complaints.   OBJECTIVE Blood pressure 119/80, pulse 77, temperature 98.2 F (36.8 C), temperature source Oral, resp. rate 18, height 5\' 3"  (1.6 m), weight  71.271 kg (157 lb 2 oz), last menstrual period 03/04/2014. GENERAL: Well-developed, well-nourished female in no acute distress.  HEENT: Normocephalic HEART: normal rate RESP: normal effort ABDOMEN: Soft, non-tender EXTREMITIES: Nontender, no edema NEURO: Alert and oriented SPECULUM EXAM: NEFG, small amount of bright red blood noted, cervix friable, 1 cm ectropion. No mucopurulent discharge. Cervix visually closed. BIMANUAL: cervix closed; uterus normal size, no adnexal tenderness or masses. No cervical motion tenderness.  LAB RESULTS Results for orders placed or performed during the hospital encounter of 04/06/14 (from the past 24 hour(s))  Urinalysis, Routine w reflex microscopic     Status: Abnormal   Collection Time: 04/06/14 12:31 PM  Result Value Ref Range   Color, Urine YELLOW YELLOW   APPearance CLEAR CLEAR   Specific Gravity, Urine >1.030 (H) 1.005 - 1.030   pH 5.5 5.0 - 8.0   Glucose, UA NEGATIVE NEGATIVE mg/dL   Hgb urine dipstick LARGE (A) NEGATIVE   Bilirubin Urine NEGATIVE NEGATIVE   Ketones, ur NEGATIVE NEGATIVE mg/dL   Protein, ur NEGATIVE NEGATIVE mg/dL   Urobilinogen, UA 0.2 0.0 - 1.0 mg/dL   Nitrite NEGATIVE NEGATIVE   Leukocytes, UA NEGATIVE NEGATIVE  Urine microscopic-add on     Status: Abnormal   Collection Time: 04/06/14 12:31  PM  Result Value Ref Range   Squamous Epithelial / LPF FEW (A) RARE   WBC, UA 0-2 <3 WBC/hpf   RBC / HPF 3-6 <3 RBC/hpf   Bacteria, UA FEW (A) RARE  hCG, quantitative, pregnancy     Status: Abnormal   Collection Time: 04/06/14  1:10 PM  Result Value Ref Range   hCG, Beta Chain, Quant, S 20 (H) <5 mIU/mL  CBC     Status: None   Collection Time: 04/06/14  1:10 PM  Result Value Ref Range   WBC 7.9 4.0 - 10.5 K/uL   RBC 4.26 3.87 - 5.11 MIL/uL   Hemoglobin 14.2 12.0 - 15.0 g/dL   HCT 95.2 84.1 - 32.4 %   MCV 93.0 78.0 - 100.0 fL   MCH 33.3 26.0 - 34.0 pg   MCHC 35.9 30.0 - 36.0 g/dL   RDW 40.1 02.7 - 25.3 %   Platelets 277 150  - 400 K/uL  ABO/Rh     Status: None   Collection Time: 04/06/14  1:10 PM  Result Value Ref Range   ABO/RH(D) O NEG   Rh IG workup (includes ABO/Rh)     Status: None (Preliminary result)   Collection Time: 04/06/14  1:15 PM  Result Value Ref Range   Gestational Age(Wks) 4.5    ABO/RH(D) O NEG    Antibody Screen NEG    Unit Number [id number]    Blood Component Type RHIG    Unit division 00    Status of Unit ISSUED    Transfusion Status OK TO TRANSFUSE   Wet prep, genital     Status: Abnormal   Collection Time: 04/06/14  2:50 PM  Result Value Ref Range   Yeast Wet Prep HPF POC NONE SEEN NONE SEEN   Trich, Wet Prep NONE SEEN NONE SEEN   Clue Cells Wet Prep HPF POC NONE SEEN NONE SEEN   WBC, Wet Prep HPF POC FEW (A) NONE SEEN    IMAGING No results found.  MAU COURSE Discussed bleeding and Quant with Dr. Mora Appl. Does not want to order ultrasound today. Recommends patient return to office in 2 days for repeat Quant and ultrasound.  Rhophylac given.  ASSESSMENT 1. First trimester bleeding   2. Rh negative state in antepartum period, first trimester, not applicable or unspecified fetus     PLAN Discharge home in stable condition per consult with Dr. Mora Appl. SAB and ectopic precautions. Pelvic rest 1 week.     Follow-up Information    Follow up with HORVATH,MICHELLE A, MD. Schedule an appointment as soon as possible for a visit on 04/08/2014.   Specialty:  Obstetrics and Gynecology   Why:  For repeat bloodwork and ultrasound   Contact information:   [address] [place] [address] [phone]       Follow up with [facility] [place] [facility].   Why:  As needed in emergencies   Contact information:   [address] [id number] mc [place] [address] [phone]       Medication List    STOP taking these medications        phentermine 37.5 MG capsule      TAKE these medications         prenatal multivitamin Tabs tablet  Take 1 tablet by mouth daily at 12 noon.         [place], CNM 04/06/2014  4:12 PM

## 2014-04-07 LAB — RH IG WORKUP (INCLUDES ABO/RH)
ABO/RH(D): O NEG
Antibody Screen: NEGATIVE
Gestational Age(Wks): 4.5
UNIT DIVISION: 0

## 2014-04-08 LAB — GC/CHLAMYDIA PROBE AMP (~~LOC~~) NOT AT ARMC
CHLAMYDIA, DNA PROBE: NEGATIVE
Neisseria Gonorrhea: NEGATIVE

## 2015-01-15 LAB — OB RESULTS CONSOLE GBS: STREP GROUP B AG: POSITIVE

## 2015-01-26 ENCOUNTER — Encounter (HOSPITAL_COMMUNITY): Payer: Self-pay | Admitting: *Deleted

## 2015-01-26 ENCOUNTER — Inpatient Hospital Stay (HOSPITAL_COMMUNITY): Payer: BLUE CROSS/BLUE SHIELD | Admitting: Anesthesiology

## 2015-01-26 ENCOUNTER — Inpatient Hospital Stay (HOSPITAL_COMMUNITY)
Admission: AD | Admit: 2015-01-26 | Discharge: 2015-01-28 | DRG: 774 | Disposition: A | Discharge status: Home / Self Care | Payer: BLUE CROSS/BLUE SHIELD | Source: Ambulatory Visit | Attending: Obstetrics and Gynecology | Admitting: Obstetrics and Gynecology

## 2015-01-26 DIAGNOSIS — O09523 Supervision of elderly multigravida, third trimester: Secondary | ICD-10-CM | POA: Diagnosis not present

## 2015-01-26 DIAGNOSIS — O26893 Other specified pregnancy related conditions, third trimester: Secondary | ICD-10-CM | POA: Diagnosis present

## 2015-01-26 DIAGNOSIS — Z8249 Family history of ischemic heart disease and other diseases of the circulatory system: Secondary | ICD-10-CM

## 2015-01-26 DIAGNOSIS — Z6791 Unspecified blood type, Rh negative: Secondary | ICD-10-CM | POA: Diagnosis present

## 2015-01-26 DIAGNOSIS — O26899 Other specified pregnancy related conditions, unspecified trimester: Secondary | ICD-10-CM | POA: Diagnosis present

## 2015-01-26 DIAGNOSIS — O99824 Streptococcus B carrier state complicating childbirth: Secondary | ICD-10-CM | POA: Diagnosis present

## 2015-01-26 DIAGNOSIS — Z833 Family history of diabetes mellitus: Secondary | ICD-10-CM | POA: Diagnosis not present

## 2015-01-26 DIAGNOSIS — Z3A38 38 weeks gestation of pregnancy: Secondary | ICD-10-CM | POA: Diagnosis not present

## 2015-01-26 LAB — COMPREHENSIVE METABOLIC PANEL
ALK PHOS: 180 U/L — AB (ref 38–126)
ALT: 11 U/L — AB (ref 14–54)
AST: 26 U/L (ref 15–41)
Albumin: 3.1 g/dL — ABNORMAL LOW (ref 3.5–5.0)
Anion gap: 9 (ref 5–15)
BILIRUBIN TOTAL: 0.6 mg/dL (ref 0.3–1.2)
BUN: 6 mg/dL (ref 6–20)
CALCIUM: 9 mg/dL (ref 8.9–10.3)
CO2: 20 mmol/L — ABNORMAL LOW (ref 22–32)
CREATININE: 0.55 mg/dL (ref 0.44–1.00)
Chloride: 104 mmol/L (ref 101–111)
Glucose, Bld: 97 mg/dL (ref 65–99)
Potassium: 3.7 mmol/L (ref 3.5–5.1)
Sodium: 133 mmol/L — ABNORMAL LOW (ref 135–145)
TOTAL PROTEIN: 6.9 g/dL (ref 6.5–8.1)

## 2015-01-26 LAB — RPR: RPR: NONREACTIVE

## 2015-01-26 LAB — CBC
HEMATOCRIT: 37.8 % (ref 36.0–46.0)
HEMOGLOBIN: 13.2 g/dL (ref 12.0–15.0)
MCH: 32.7 pg (ref 26.0–34.0)
MCHC: 34.9 g/dL (ref 30.0–36.0)
MCV: 93.6 fL (ref 78.0–100.0)
PLATELETS: 227 10*3/uL (ref 150–400)
RBC: 4.04 MIL/uL (ref 3.87–5.11)
RDW: 13.5 % (ref 11.5–15.5)
WBC: 14.4 10*3/uL — ABNORMAL HIGH (ref 4.0–10.5)

## 2015-01-26 LAB — URIC ACID: URIC ACID, SERUM: 3.3 mg/dL (ref 2.3–6.6)

## 2015-01-26 MED ORDER — ACETAMINOPHEN 325 MG PO TABS: 650.0000 mg | ORAL_TABLET | ORAL | Status: DC | PRN

## 2015-01-26 MED ORDER — OXYCODONE-ACETAMINOPHEN 5-325 MG PO TABS: 1.0000 | ORAL_TABLET | ORAL | Status: DC | PRN

## 2015-01-26 MED ORDER — PENICILLIN G POTASSIUM 5000000 UNITS IJ SOLR
2.5000 10*6.[IU] | INTRAVENOUS | Status: DC
  Administered 2015-01-26 (×2): 2.5 10*6.[IU] via INTRAVENOUS
  Filled 2015-01-26 (×5): qty 2.5

## 2015-01-26 MED ORDER — SENNOSIDES-DOCUSATE SODIUM 8.6-50 MG PO TABS
2.0000 | ORAL_TABLET | ORAL | Status: DC
  Administered 2015-01-27 (×2): 2 via ORAL
  Filled 2015-01-26 (×2): qty 2

## 2015-01-26 MED ORDER — DIBUCAINE 1 % RE OINT: 1.0000 "application " | TOPICAL_OINTMENT | RECTAL | Status: DC | PRN

## 2015-01-26 MED ORDER — LIDOCAINE HCL (PF) 1 % IJ SOLN
30.0000 mL | INTRAMUSCULAR | Status: DC | PRN
Start: 2015-01-26 — End: 2015-01-26
  Filled 2015-01-26: qty 30

## 2015-01-26 MED ORDER — LANOLIN HYDROUS EX OINT: TOPICAL_OINTMENT | CUTANEOUS | Status: DC | PRN

## 2015-01-26 MED ORDER — IBUPROFEN 600 MG PO TABS
600.0000 mg | ORAL_TABLET | Freq: Four times a day (QID) | ORAL | Status: DC
  Administered 2015-01-27 (×2): 600 mg via ORAL
  Filled 2015-01-26 (×2): qty 1

## 2015-01-26 MED ORDER — TERBUTALINE SULFATE 1 MG/ML IJ SOLN
0.2500 mg | Freq: Once | INTRAMUSCULAR | Status: DC | PRN
  Filled 2015-01-26: qty 1

## 2015-01-26 MED ORDER — OXYCODONE-ACETAMINOPHEN 5-325 MG PO TABS: 2.0000 | ORAL_TABLET | ORAL | Status: DC | PRN

## 2015-01-26 MED ORDER — ACETAMINOPHEN 500 MG PO TABS
1000.0000 mg | ORAL_TABLET | Freq: Four times a day (QID) | ORAL | Status: DC | PRN
  Administered 2015-01-26: 1000 mg via ORAL
  Filled 2015-01-26: qty 2

## 2015-01-26 MED ORDER — ONDANSETRON HCL 4 MG PO TABS: 4.0000 mg | ORAL_TABLET | ORAL | Status: DC | PRN

## 2015-01-26 MED ORDER — WITCH HAZEL-GLYCERIN EX PADS: 1.0000 "application " | MEDICATED_PAD | CUTANEOUS | Status: DC | PRN

## 2015-01-26 MED ORDER — GENTAMICIN SULFATE 40 MG/ML IJ SOLN
2.0000 mg/kg | Freq: Once | INTRAVENOUS | Status: AC
  Administered 2015-01-26: 160 mg via INTRAVENOUS
  Filled 2015-01-26: qty 4

## 2015-01-26 MED ORDER — BUPIVACAINE HCL (PF) 0.25 % IJ SOLN
INTRAMUSCULAR | Status: DC | PRN
  Administered 2015-01-26 (×2): 4 mL via EPIDURAL

## 2015-01-26 MED ORDER — OXYTOCIN 40 UNITS IN LACTATED RINGERS INFUSION - SIMPLE MED
1.0000 m[IU]/min | INTRAVENOUS | Status: DC
  Administered 2015-01-26: 2 m[IU]/min via INTRAVENOUS

## 2015-01-26 MED ORDER — ONDANSETRON HCL 4 MG/2ML IJ SOLN: 4.0000 mg | Freq: Four times a day (QID) | INTRAMUSCULAR | Status: DC | PRN

## 2015-01-26 MED ORDER — FLEET ENEMA 7-19 GM/118ML RE ENEM: 1.0000 | ENEMA | RECTAL | Status: DC | PRN

## 2015-01-26 MED ORDER — ONDANSETRON HCL 4 MG/2ML IJ SOLN: 4.0000 mg | INTRAMUSCULAR | Status: DC | PRN

## 2015-01-26 MED ORDER — OXYCODONE-ACETAMINOPHEN 5-325 MG PO TABS
2.0000 | ORAL_TABLET | ORAL | Status: DC | PRN
  Administered 2015-01-27 – 2015-01-28 (×2): 2 via ORAL
  Filled 2015-01-26 (×2): qty 2

## 2015-01-26 MED ORDER — PRENATAL MULTIVITAMIN CH
1.0000 | ORAL_TABLET | Freq: Every day | ORAL | Status: DC
  Administered 2015-01-27: 1 via ORAL
  Filled 2015-01-26: qty 1

## 2015-01-26 MED ORDER — OXYTOCIN BOLUS FROM INFUSION: 500.0000 mL | INTRAVENOUS | Status: DC

## 2015-01-26 MED ORDER — OXYTOCIN 40 UNITS IN LACTATED RINGERS INFUSION - SIMPLE MED
62.5000 mL/h | INTRAVENOUS | Status: DC
  Filled 2015-01-26: qty 1000

## 2015-01-26 MED ORDER — BENZOCAINE-MENTHOL 20-0.5 % EX AERO
1.0000 | INHALATION_SPRAY | CUTANEOUS | Status: DC | PRN
Start: 2015-01-26 — End: 2015-01-28
  Administered 2015-01-27: 1 via TOPICAL
  Filled 2015-01-26: qty 56

## 2015-01-26 MED ORDER — LACTATED RINGERS IV SOLN
INTRAVENOUS | Status: DC
  Administered 2015-01-26 (×2): via INTRAVENOUS

## 2015-01-26 MED ORDER — FENTANYL 2.5 MCG/ML BUPIVACAINE 1/10 % EPIDURAL INFUSION (WH - ANES)
14.0000 mL/h | INTRAMUSCULAR | Status: DC | PRN
  Administered 2015-01-26 (×2): 14 mL/h via EPIDURAL
  Filled 2015-01-26 (×3): qty 125

## 2015-01-26 MED ORDER — LACTATED RINGERS IV SOLN
500.0000 mL | INTRAVENOUS | Status: DC | PRN
  Administered 2015-01-26: 500 mL via INTRAVENOUS

## 2015-01-26 MED ORDER — DIPHENHYDRAMINE HCL 50 MG/ML IJ SOLN: 12.5000 mg | INTRAMUSCULAR | Status: DC | PRN

## 2015-01-26 MED ORDER — LIDOCAINE-EPINEPHRINE (PF) 2 %-1:200000 IJ SOLN
INTRAMUSCULAR | Status: DC | PRN
  Administered 2015-01-26: 4 mL

## 2015-01-26 MED ORDER — EPHEDRINE 5 MG/ML INJ
10.0000 mg | INTRAVENOUS | Status: DC | PRN
  Filled 2015-01-26: qty 2

## 2015-01-26 MED ORDER — ACETAMINOPHEN 325 MG PO TABS
650.0000 mg | ORAL_TABLET | ORAL | Status: DC | PRN
Start: 2015-01-26 — End: 2015-01-28

## 2015-01-26 MED ORDER — DEXTROSE 5 % IV SOLN
5.0000 10*6.[IU] | Freq: Once | INTRAVENOUS | Status: AC
  Administered 2015-01-26: 5 10*6.[IU] via INTRAVENOUS
  Filled 2015-01-26: qty 5

## 2015-01-26 MED ORDER — PHENYLEPHRINE 40 MCG/ML (10ML) SYRINGE FOR IV PUSH (FOR BLOOD PRESSURE SUPPORT)
80.0000 ug | PREFILLED_SYRINGE | INTRAVENOUS | Status: DC | PRN
  Administered 2015-01-26 (×2): 80 ug via INTRAVENOUS
  Filled 2015-01-26: qty 2
  Filled 2015-01-26: qty 20

## 2015-01-26 MED ORDER — CITRIC ACID-SODIUM CITRATE 334-500 MG/5ML PO SOLN
30.0000 mL | ORAL | Status: DC | PRN
  Administered 2015-01-26: 30 mL via ORAL
  Filled 2015-01-26: qty 15

## 2015-01-26 NOTE — Anesthesia Procedure Notes (Signed)
Epidural Patient location during procedure: OB  Staffing Anesthesiologist: Tifini Reeder Performed by: anesthesiologist   Preanesthetic Checklist Completed: patient identified, surgical consent, pre-op evaluation, timeout performed, IV checked, risks and benefits discussed and monitors and equipment checked  Epidural Patient position: sitting Prep: DuraPrep Patient monitoring: heart rate, cardiac monitor, continuous pulse ox and blood pressure Approach: midline Location: L3-L4 Injection technique: LOR saline  Needle:  Needle type: Tuohy  Needle gauge: 17 G Needle length: 9 cm Needle insertion depth: 5 cm Catheter type: closed end flexible Catheter size: 19 Gauge Catheter at skin depth: 11 cm Test dose: negative and 2% lidocaine with Epi 1:200 K  Assessment Events: blood not aspirated, injection not painful, no injection resistance, negative IV test and no paresthesia  Additional Notes Reason for block:procedure for pain   

## 2015-01-26 NOTE — Anesthesia Preprocedure Evaluation (Signed)
Anesthesia Evaluation  Patient identified by MRN, date of birth, ID band Patient awake    Reviewed: Allergy & Precautions, NPO status , Patient's Chart, lab work & pertinent test results  History of Anesthesia Complications Negative for: history of anesthetic complications  Airway Mallampati: II  TM Distance: >3 FB Neck ROM: Full    Dental  (+) Teeth Intact   Pulmonary neg pulmonary ROS,    breath sounds clear to auscultation       Cardiovascular negative cardio ROS   Rhythm:Regular     Neuro/Psych negative neurological ROS  negative psych ROS   GI/Hepatic negative GI ROS, Neg liver ROS,   Endo/Other  negative endocrine ROS  Renal/GU negative Renal ROS  negative genitourinary   Musculoskeletal negative musculoskeletal ROS (+)   Abdominal   Peds negative pediatric ROS (+)  Hematology negative hematology ROS (+)   Anesthesia Other Findings   Reproductive/Obstetrics negative OB ROS                             Anesthesia Physical Anesthesia Plan  ASA: II  Anesthesia Plan: Epidural   Post-op Pain Management:    Induction:   Airway Management Planned:   Additional Equipment:   Intra-op Plan:   Post-operative Plan:   Informed Consent: I have reviewed the patients History and Physical, chart, labs and discussed the procedure including the risks, benefits and alternatives for the proposed anesthesia with the patient or authorized representative who has indicated his/her understanding and acceptance.     Plan Discussed with: Anesthesiologist  Anesthesia Plan Comments:         Anesthesia Quick Evaluation

## 2015-01-26 NOTE — Progress Notes (Signed)
Subjective:   Comfortable with epidural, feeling slight intermittent pressure.   Objective:   VS: Blood pressure 120/79, pulse 90, temperature 98.5 F (36.9 C), temperature source Oral, resp. rate 18, height 5\' 3"  (1.6 m), weight 80.74 kg (178 lb), last menstrual period 05/05/2014, SpO2 100 %. FHR: baseline 145 / variability mod / accelerations + / occ. variable decelerations Toco: contractions every 3-4 minutes Cervix: 6.5/-1 Membranes: clear  Assessment:  Labor: protracted  FHR category II  Plan:  Pitocin augmentation, peanut ball and change lateral positions often, anticipate SVD.     Karla LarryBHAMBRI, [place]i Goh, N MSN, CNM 01/26/2015, 3:48 PM

## 2015-01-26 NOTE — MAU Note (Signed)
Contractions since 1900. Bloody show. Denies LOF. 3cm last exam

## 2015-01-26 NOTE — Progress Notes (Signed)
Fabian NovemberM. Bhambri CNM notified of pt's sve. Will reck in an hour and do labs due to elevated B/Ps.

## 2015-01-26 NOTE — Progress Notes (Signed)
Subjective:   Comfortable with epidural, rt lateral  Objective:   VS: Blood pressure 99/64, pulse 89, temperature 98.5 F (36.9 C), temperature source Oral, resp. rate 18, height 5\' 3"  (1.6 m), weight 80.74 kg (178 lb), last menstrual period 05/05/2014, SpO2 97 %. FHR: baseline 135 / variability mod / accelerations + / no decelerations Toco: contractions every 2-5 minutes Cervix: deferred Membranes: intact  Assessment:  Labor: early active FHR category I GBS positive  Plan:  Rt lateral with peanut ball, consider AROM 4 hrs after PCN dose, anticipate SVD.     [name], Savannaha Stonerock, N MSN, CNM 01/26/2015, 10:13 AM

## 2015-01-26 NOTE — MAU Note (Signed)
[facility]. Pt may come to 165

## 2015-01-26 NOTE — Progress Notes (Signed)
Donette LarryMelanie Bhambri CNM notified of pt's admission and status. Aware of ctx pattern, FM strip, elevated B/Ps. RN to ck cervix.

## 2015-01-26 NOTE — Progress Notes (Signed)
Subjective:   Comfortable with epidural, left lateral.  Objective:   VS: Blood pressure 113/92, pulse 93, temperature 98.5 F (36.9 C), temperature source Oral, resp. rate 18, height 5\' 3"  (1.6 m), weight 80.74 kg (178 lb), last menstrual period 05/05/2014, SpO2 100 %. FHR: baseline 145 / variability mod / accelerations + / early decelerations Toco: contractions every 3-4 minutes Cervix: Dilation: 6 Effacement (%): 80 Station: -1 Exam by::  (Lafaye Mcelmurry cnm) Membranes: AROM, clear, abundant  Assessment:  Labor: active FHR category I  Plan:  Expectant mngt, anticipate labor progression and SVD. Dr. Billy Coastaavon updated with A/P.     Karla LarryBHAMBRI, Karla Mcintosh, N MSN, CNM 01/26/2015, 11:34 AM

## 2015-01-26 NOTE — H&P (Signed)
  OB ADMISSION/ HISTORY & PHYSICAL:  Admission Date: 01/26/2015  4:15 AM  Admit Diagnosis: 38.[redacted] weeks gestation  Karla Mcintosh is a 34 y.o. female presenting for labor.  Prenatal History: [id number]   EDC:02/07/2015, by Other Basis   [facility]  Primary Ob Provider: Marlinda Mikeanya Bailey, CNM Prenatal course complicated by AMA, LGA-EFW 6'11 at 34 wks (pelvis proven to 7'1), GBS positive, and Rh negative.  Prenatal Labs: ABO, Rh:   O Neg Antibody: NEG (01/30 1315) Rubella:   Immune RPR:   NR HBsAg:   Neg HIV:   Neg GBS: Positive (11/09 0000)  1 hr GTT: 98 Genetic screen: nml  Medical / Surgical History :  Past medical history: History reviewed. No pertinent past medical history.   Past surgical history:  Past Surgical History  Procedure Laterality Date  . No past surgeries      Family History:  Family History  Problem Relation Age of Onset  . Diabetes Father   . Cancer Maternal Grandmother     thyroid cancer  . Arthritis Paternal Grandmother     rheumatoid  . Heart disease Paternal Grandfather      Social History:  reports that she has never smoked. She has never used smokeless tobacco. She reports that she drinks alcohol. She reports that she does not use illicit drugs.  Allergies: Review of patient's allergies indicates no known allergies.   Current Medications at time of admission:  Prior to Admission medications   Medication Sig Start Date End Date Taking? Authorizing Provider  Magnesium 250 MG TABS Take by mouth.   Yes Historical Provider, MD  ranitidine (ZANTAC) 75 MG tablet Take 75 mg by mouth 2 (two) times daily.   Yes Historical Provider, MD  Prenatal Vit-Fe Fumarate-FA (PRENATAL MULTIVITAMIN) TABS tablet Take 1 tablet by mouth daily at 12 noon.    Historical Provider, MD     Review of Systems: +FM +ctx since 0300 +bloody show No LOF No back pain  Physical Exam:  VS: Blood pressure 115/78, pulse 85, temperature 98.5 F  (36.9 C), temperature source Oral, resp. rate 18, height 5\' 3"  (1.6 m), weight 80.74 kg (178 lb), last menstrual period 05/05/2014.  General: alert and oriented, appears uncomfortable with ctx Heart: RRR Lungs: Clear lung fields Abdomen: Gravid, soft and non-tender, non-distended / uterus: gravid, non-tender Extremities: no edema Genitalia / VE: Dilation: 4.5 Effacement (%): 80 Station: -1 Exam by:: Quintella BatonJo Barham RNc ; EFW: 7.5 lbs by leopolds FHR: baseline rate 145 / variability mod / accelerations + / no decelerations TOCO: 4-5  Assessment: 38.[redacted] weeks gestation Labor: early active FHR category I GBS: pos  Plan:  Admit, continuous EFM, epidural, PCN for GBS, anticipate SVD Dr. Billy Coastaavon notified of admission / plan of care   Lawernce PittsBHAMBRI, Leyton Brownlee, N MSN, CNM 01/26/2015, 8:04 AM

## 2015-01-26 NOTE — Progress Notes (Signed)
Fabian NovemberM Bhambri CNM notified of labs, B/Ps, sve. Willl admit to BS.

## 2015-01-26 NOTE — Progress Notes (Signed)
Subjective:   Comfortable, actively pushing x30 min.  Objective:   VS: 101.1 Ax, 114, 18, 119/70 FHR: baseline 180 / variability mod / accelerations none / variable decelerations Toco: contractions every 2 minutes Cervix: Dilation: 10 Effacement (%): 90 Station: +1 Exam by:: Randi Poullard Membranes: clear Pitocin: 6 mu/min  Assessment:  Labor: second stage FHR category II Maternal fever  Plan:  IVF bolus, Tylenol 1gm, continue pushing, anticipate SVD.  Consulted with Dr. Rudie Meyerousins-recommends abx-Gentamicin.     [name], Dewain Platz, N MSN, CNM 01/26/2015, 7:21 PM

## 2015-01-27 LAB — CBC
HEMATOCRIT: 34.6 % — AB (ref 36.0–46.0)
HEMOGLOBIN: 12 g/dL (ref 12.0–15.0)
MCH: 32.5 pg (ref 26.0–34.0)
MCHC: 34.7 g/dL (ref 30.0–36.0)
MCV: 93.8 fL (ref 78.0–100.0)
Platelets: 203 10*3/uL (ref 150–400)
RBC: 3.69 MIL/uL — AB (ref 3.87–5.11)
RDW: 13.7 % (ref 11.5–15.5)
WBC: 21.9 10*3/uL — ABNORMAL HIGH (ref 4.0–10.5)

## 2015-01-27 MED ORDER — IBUPROFEN 800 MG PO TABS
800.0000 mg | ORAL_TABLET | Freq: Three times a day (TID) | ORAL | Status: DC
  Administered 2015-01-27 – 2015-01-28 (×3): 800 mg via ORAL
  Filled 2015-01-27 (×3): qty 1

## 2015-01-27 NOTE — Lactation Note (Signed)
This note was copied from the chart of Boy Reatha Harpsshley Housey. Lactation Consultation Note  Patient Name: Boy Reatha Harpsshley Bogart ZOXWR'UToday's Date: 01/27/2015 Reason for consult: Follow-up assessment (per mom last time attempted at 1pm - baby has been sleepy, enc to page for feeding cues )  Per  Mom the baby has been sleepy today. LC updated doc flow sheets with attempts, voids , stools per parents.  Per dad , company will be leaving soon , and requested to come back.    Maternal Data    Feeding Feeding Type: Breast Fed  LATCH Score/Interventions                Intervention(s): Breastfeeding basics reviewed     Lactation Tools Discussed/Used     Consult Status Consult Status: Follow-up Date: 01/27/15 Follow-up type: In-patient    Kathrin Greathouseorio, Jadan Rouillard Ann 01/27/2015, 2:39 PM

## 2015-01-27 NOTE — Anesthesia Postprocedure Evaluation (Signed)
  Anesthesia Post-op Note  Patient: Karla Mcintosh  Procedure(s) Performed: * No procedures listed *  Patient Location: Mother/Baby  Anesthesia Type:Epidural  Level of Consciousness: awake, alert , oriented and patient cooperative  Airway and Oxygen Therapy: Patient Spontanous Breathing  Post-op Pain: none  Post-op Assessment: Post-op Vital signs reviewed, Patient's Cardiovascular Status Stable, Respiratory Function Stable, Patent Airway, No headache, No backache and Patient able to bend at knees              Post-op Vital Signs: Reviewed and stable  Last Vitals:  Filed Vitals:   01/26/15 2300 01/27/15 0500  BP: 112/67 111/65  Pulse: 84 85  Temp: 36.9 C 36.6 C  Resp: 20 20    Complications: No apparent anesthesia complications

## 2015-01-27 NOTE — Progress Notes (Signed)
PPD 1 SVD  S:  Reports feeling well - just really tired             Tolerating po/ No nausea or vomiting             Bleeding is light             Pain controlled with motrin but still intense cramps             Up ad lib / ambulatory / voiding QS  Newborn breast feeding  / Circumcision planned O:               VS: BP 111/65 mmHg  Pulse 85  Temp(Src) 97.8 F (36.6 C) (Oral)  Resp 20  Ht 5\' 3"  (1.6 m)  Wt 80.74 kg (178 lb)  BMI 31.54 kg/m2  SpO2 100%  LMP 05/05/2014  Breastfeeding? Unknown   LABS:              Recent Labs  01/26/15 0525 01/27/15 0531  WBC 14.4* 21.9*  HGB 13.2 12.0  PLT 227 203               Blood type: --/--/O NEG (11/20 0745)  / newborn  O Negative (no rhophyac indicated) Rubella:   Immune                    I&O: Intake/Output      11/20 0701 - 11/21 0700 11/21 0701 - 11/22 0700   Urine (mL/kg/hr) 600 (0.3)    Blood 187 (0.1)    Total Output 787     Net -787                        Physical Exam:             Alert and oriented X3  Lungs: Clear and unlabored  Heart: regular rate and rhythm / no mumurs  Abdomen: soft, non-tender, non-distended              Fundus: firm, non-tender, U-1  Perineum: moderate edema / ice pack in place  Lochia: light  Extremities: trace pedal edema, no calf pain or tenderness  A: PPD # 1   Doing well - stable status  P: Routine post partum orders             Increase motrin dose today - anticipate CD tomorrow    Marlinda MikeBAILEY, TANYA CNM, MSN, [facility] Center[facility]ACNM 01/27/2015, 9:35 AM

## 2015-01-27 NOTE — Lactation Note (Signed)
This note was copied from the chart of Karla Mcintosh [place]. Lactation Consultation Note Experienced BF mom of 10 months to her 2nd child but he is now 859 yrs old. Denied any difficulty BF the 2nd child. This baby is latch and wanting to cluster feed all ready. Has positional stripe to Rt. Nipple. Encouraged to roll nipples to evert more, has short shafts. She said it didn't hurt, but baby must had slipped down some. Encouraged cheeks to breast and flange lips if needed for wider deeper latch.  Mom encouraged to feed baby 8-12 times/24 hours and with feeding cues. Mom encouraged to waken baby for feeds. Mom encouraged to do skin-to-skin. Educated about newborn behavior, I&O, cluster feeding, supply and demand. WH/LC brochure given w/resources, support groups and LC services. Patient Name: Karla Mcintosh Zinni ZOXWR'UToday's Date: 01/27/2015 Reason for consult: Initial assessment   Maternal Data Has patient been taught Hand Expression?: Yes Does the patient have breastfeeding experience prior to this delivery?: Yes  Feeding    LATCH Score/Interventions       Type of Nipple: Everted at rest and after stimulation  Comfort (Breast/Nipple): Filling, red/small blisters or bruises, mild/mod discomfort  Problem noted: Mild/Moderate discomfort Interventions (Mild/moderate discomfort): Hand expression;Hand massage  Intervention(s): Skin to skin;Position options;Support Pillows;Breastfeeding basics reviewed     Lactation Tools Discussed/Used WIC Program: No   Consult Status Consult Status: Follow-up Date: 01/28/15 Follow-up type: In-patient    Nilza Eaker, Diamond NickelLAURA G 01/27/2015, 7:03 AM

## 2015-01-27 NOTE — Lactation Note (Addendum)
This note was copied from the chart of Karla Reatha Harpsshley [place]. Lactation Consultation Note  Patient Name: Karla Reatha Harpsshley Cech RUEAV'WToday's Date: 01/27/2015 Reason for consult: Follow-up assessment -  21 hours old - , has been sleepy today , even though mom has attempted latching.  Prior to this latch - had a small mec stoll - thinning.  Latched after several attempts with mtuliply swallows , increased with breast compressions.  Gulps noted. Per mom  Comfortable. Tea cup hold ended up working the best to finally latch the baby. Milk  Still feeding at 10 mins , with multiply swallows, mom still comfortable.  MBU RN aware to update total time for feeding.  LC also reviewed basics of latching and the importance of breast massage , hand express prior to latch - and breast compressions With latch until swallows, and then intermittent during feeding. Baby has stopped spiting , and has increased stooling.  Mom seemed to be very pleased baby was feeding well.      Maternal Data Has patient been taught Hand Expression?: Yes  Feeding Feeding Type: Breast Fed Length of feed:  (multiply swallows and gulps )  LATCH Score/Interventions Latch: Repeated attempts needed to sustain latch, nipple held in mouth throughout feeding, stimulation needed to elicit sucking reflex. Intervention(s): Adjust position;Assist with latch;Breast massage;Breast compression  Audible Swallowing: Spontaneous and intermittent  Type of Nipple: Everted at rest and after stimulation  Comfort (Breast/Nipple): Soft / non-tender     Hold (Positioning): Assistance needed to correctly position infant at breast and maintain latch. Intervention(s): Breastfeeding basics reviewed;Support Pillows;Position options;Skin to skin  LATCH Score: 8  Lactation Tools Discussed/Used     Consult Status Consult Status: Follow-up Date: 01/28/15 Follow-up type: In-patient    Karla Mcintosh, Karla Mcintosh 01/27/2015, 4:14 PM

## 2015-01-28 DIAGNOSIS — Z6791 Unspecified blood type, Rh negative: Secondary | ICD-10-CM

## 2015-01-28 DIAGNOSIS — O26899 Other specified pregnancy related conditions, unspecified trimester: Secondary | ICD-10-CM | POA: Diagnosis present

## 2015-01-28 MED ORDER — IBUPROFEN 800 MG PO TABS: 800.0000 mg | ORAL_TABLET | Freq: Three times a day (TID) | ORAL | Status: AC

## 2015-01-28 MED ORDER — OXYCODONE-ACETAMINOPHEN 5-325 MG PO TABS: 1.0000 | ORAL_TABLET | ORAL | Status: DC | PRN

## 2015-01-28 NOTE — Lactation Note (Signed)
This note was copied from the chart of Boy Reatha Harpsshley [place]. Lactation Consultation Note  Patient Name: Boy Reatha Harpsshley Talsma [id number]'XToday's Date: 01/28/2015 Reason for consult: Follow-up assessment;Other (Comment) (6% weight loss, Bili check 28 hours - 4.2 )  Baby is 40 hours old and presently in the nursery for a circ. Review of doc flow sheets - Breast feeding range - 15 -60 mins , since LC saw mom yesterday. 7 wets , 10 stools, Latch scores - 10 -8-9. Per mom the baby really has picked up since yesterday. Mom denies sore nipples Sore nipples and engorgement prevention and tx reviewed.  Per mom will have a pump at home.  Mother informed of post-discharge support and given phone number to the lactation department, including services  for phone call assistance; out-patient appointments; and breastfeeding support group. List of other breastfeeding  resources in the community given in the handout. Encouraged mother to call for problems or concerns related to breastfeeding.    Maternal Data    Feeding Feeding Type:  (per mom last fed at 0930 ) Length of feed: 20 min (per mom )  LATCH Score/Interventions                Intervention(s): Breastfeeding basics reviewed     Lactation Tools Discussed/Used     Consult Status Consult Status: Complete Date: 01/28/15    Kathrin Greathouseorio, Erykah Lippert Ann 01/28/2015, 11:38 AM

## 2015-01-28 NOTE — Discharge Summary (Signed)
Obstetric Discharge Summary Reason for Admission: onset of labor and 38.[redacted] weeks gestation Prenatal Course: complicated by AMA, LGA, GBS positive, and Rh neg Intrapartum Procedures: spontaneous vaginal delivery, GBS prophylaxis and AROM, Pitocin, epidural, maternal fever, fetal tachycardia Postpartum Procedures: none Complications-Operative and Postpartum: 2nd  degree perineal laceration and rt labial laceration HEMOGLOBIN  Date Value Ref Range Status  01/27/2015 12.0 12.0 - 15.0 g/dL Final   HCT  Date Value Ref Range Status  01/27/2015 34.6* 36.0 - 46.0 % Final    Physical Exam:  General: alert, cooperative and no distress Lochia: appropriate Uterine Fundus: firm Incision: healing well, no significant drainage, no dehiscence, no significant erythema DVT Evaluation: No evidence of DVT seen on physical exam. Negative Homan's sign. No cords or calf tenderness. No significant calf/ankle edema.  Discharge Diagnoses: Term Pregnancy-delivered  Discharge Information: Date: 01/28/2015 Activity: pelvic rest Diet: routine Medications: PNV, Ibuprofen and Percocet Condition: stable Instructions: refer to practice specific booklet Discharge to: home Follow-up Information    Follow up with Marlinda MikeBAILEY, TANYA, CNM. Schedule an appointment as soon as possible for a visit in 6 weeks.   Specialty:  Obstetrics and Gynecology   Contact information:   [address] [place] [id number] [phone]       Newborn Data: Live born female  Birth Weight: 8 lb 6.8 oz (3820 g) APGAR: 7, 9  Home with mother.  Ekam Besson, N 01/28/2015, 10:31 AM

## 2015-01-28 NOTE — Progress Notes (Signed)
PPD #2- SVD  Subjective:   Reports feeling better today, less pain with nursing Tolerating po/ No nausea or vomiting Bleeding is light Pain controlled with Motrin and Percocet Up ad lib / ambulatory / voiding without problems Newborn: breastfeeding  / Circumcision: planning today  Objective:   VS: VS:  Filed Vitals:   01/27/15 0500 01/27/15 1000 01/27/15 1815 01/28/15 0529  BP: 111/65 114/65 133/75 119/78  Pulse: 85 94 83 73  Temp: 97.8 F (36.6 C) 98.4 F (36.9 C) 98.3 F (36.8 C) 97.9 F (36.6 C)  TempSrc: Oral Oral Oral Oral  Resp: 20 20 19 18   Height:      Weight:      SpO2:        LABS:  Recent Labs  01/26/15 0525 01/27/15 0531  WBC 14.4* 21.9*  HGB 13.2 12.0  PLT 227 203   Blood type: --/--/O NEG (11/20 0745) Rubella:   Immune               I&O: Intake/Output      11/21 0701 - 11/22 0700 11/22 0701 - 11/23 0700   Urine (mL/kg/hr)     Blood     Total Output       Net              Physical Exam: Alert and oriented X3 Abdomen: soft, non-tender, non-distended  Fundus: firm, non-tender, U-2 Perineum: Well approximated, no significant erythema, edema, or drainage; healing well. Lochia: small Extremities: trace LE edema, no calf pain or tenderness   Assessment: PPD #2  [id number]/ S/P:spontaneous vaginal, 2nd degree and labial laceration Rh negative-baby Rh neg Doing well - stable for discharge home  Plan: Discharge home RX's:  Ibuprofen 600mg  po Q 6 hrs prn pain #30 Refill x 0 Percocet 5/325 1 to 2 po Q 4 hrs prn pain #30 Refill x 0 Follow up in 6 wks at [facility] for postpartum visit [facility] booklet given    [name], Kanen Mottola, N MSN, CNM 01/28/2015, 10:27 AM

## 2015-01-30 LAB — TYPE AND SCREEN
ABO/RH(D): O NEG
Antibody Screen: POSITIVE
DAT, IGG: NEGATIVE
UNIT DIVISION: 0
Unit division: 0

## 2015-08-12 ENCOUNTER — Telehealth (HOSPITAL_COMMUNITY): Payer: Self-pay | Admitting: Lactation Services

## 2015-08-12 NOTE — Telephone Encounter (Signed)
Mom called reporting decrease in milk supply. Baby now 186 months old. Mom started solids with baby at 675 months of age. Mom giving solids 1st then BF after. This is the only new change in routine. Baby not going to breast as often, now 6 times/day. Mom has tried power pumping 1 time in am and 1 time in pm but not seeing any improvement in milk supply. Mom is not on any new medications, no birth control. Has had a cold but did not take antihistamines. Advice to Mom: Start BF at least 8 times per day. BF before giving solids when baby hungry and will be more vigorous at breast. Be sure baby is emptying breast with feedings. Pump at least 4 times/day for 15-30 minutes, start supplements - recommended either Lactation Support by [employer] or More Milk Plus by [employer]. Call if no improvement.

## 2015-11-15 ENCOUNTER — Telehealth (HOSPITAL_COMMUNITY): Payer: Self-pay | Admitting: Lactation Services

## 2015-11-15 NOTE — Telephone Encounter (Signed)
Pt. Called because saw a decrease in her milk supply over the past 2 weeks; pumping one ounce with each pumping.  States she has tried power pumping and milk supply is not increasing.  States she began taking Birth Control Pills 2 months ago and wanted to know if this was the reason for her decrease in milk supply.  Discussed with mother the number of times the infant is feeding and she states he is only nursing twice a day.  States the infant is eating more table/solid foods.  States infant was only eating 2-3 times per day when she began taking the BCPs 2 months ago.  Discussed with mom that BCPs can cause a decrease in milk supply but d/t fact infant is eating more solids and breastfeeding less that milk supply decrease could potentially be d/t supply and demand (decrease demand = decreased supply).  LC suggested mom discuss with OB type of BCP prescribed and to also discuss with Peds as 3 ounces of milk per day (as evidenced by pumping but could be slightly more since baby is breastfeeding at breast twice a day) is still not sufficient for a 439.85 month old.  Mom stated that she plans to supplement with formula since milk supply is not increasing to assure infant is getting the milk intake he needs.  Encouraged to follow up with OB and Peds and to call for any further questions.

## 2017-01-12 ENCOUNTER — Ambulatory Visit (INDEPENDENT_AMBULATORY_CARE_PROVIDER_SITE_OTHER): Payer: BLUE CROSS/BLUE SHIELD | Admitting: Physician Assistant

## 2017-01-12 VITALS — BP 120/98 | HR 125 | Temp 98.1°F | Resp 16 | Ht 64.5 in | Wt 163.2 lb

## 2017-01-12 DIAGNOSIS — J029 Acute pharyngitis, unspecified: Secondary | ICD-10-CM

## 2017-01-12 MED ORDER — AMOXICILLIN 500 MG PO CAPS: 500.0000 mg | ORAL_CAPSULE | Freq: Three times a day (TID) | ORAL | 0 refills | Status: DC

## 2017-01-12 NOTE — Progress Notes (Signed)
Patient ID: Karla Mcintosh MRN: [id number], DOB: [birth year], 36 y.o. Date of Encounter: 01/12/2017, 4:41 PM    Chief Complaint:  Chief Complaint  Patient presents with  . Sore Throat    started today  . white patches in throat     HPI: 36 y.o. year old female presents with above.   She states that actually last night her throat started getting sore.  But then today when she looked at her throat saw the white spots developing.  She also is having some body aches.  Also has been having some fever.  Temperature was 100.8 earlier today.  She has taken some Advil's and now is having temperature 98.1.  She has had no nasal congestion or mucus from her nose.  No cough no chest congestion.  Says that she has always had large tonsils and gets prone to getting sore throats says that that is why she came on in to get checked.  She has had no additional symptoms.  No other concerns to address today.  She does not work outside of the home.  She is at home with her 772-year-old son.     Home Meds:   Outpatient Medications Prior to Visit  Medication Sig Dispense Refill  . ibuprofen (ADVIL,MOTRIN) 800 MG tablet Take 1 tablet (800 mg total) by mouth every 8 (eight) hours. 30 tablet 0  . Magnesium 250 MG TABS Take 1 tablet by mouth daily.     Marland Kitchen. oxyCODONE-acetaminophen (PERCOCET/ROXICET) 5-325 MG tablet Take 1-2 tablets by mouth every 4 (four) hours as needed (for pain scale equal to or greater than 7). 30 tablet 0  . Prenatal Vit-Fe Fumarate-FA (PRENATAL MULTIVITAMIN) TABS tablet Take 1 tablet by mouth daily at 12 noon.    . ranitidine (ZANTAC) 75 MG tablet Take 75 mg by mouth 2 (two) times daily.     No facility-administered medications prior to visit.     Allergies: No Known Allergies    Review of Systems: See HPI for pertinent ROS. All other ROS negative.    Physical Exam: Blood pressure (!) 120/98, pulse (!) 125, temperature 98.1 F (36.7 C), temperature source Oral, resp. rate 16, height 5'  4.5" (1.638 m), weight 74 kg (163 lb 3.2 oz), last menstrual period 01/02/2017, SpO2 98 %, unknown if currently breastfeeding., Body mass index is 27.58 kg/m. General:  WNWD wF. Appears in no acute distress. HEENT: Normocephalic, atraumatic, eyes without discharge, sclera non-icteric, nares are without discharge. Bilateral auditory canals clear, TM's are without perforation, pearly grey and translucent with reflective cone of light bilaterally. Oral cavity moist, posterior pharynx normal. Tonsils are very large--left > right. Tonsil with moderate erythema and with moderate exudate. No peritonsillar abscess. Neck: Supple. No thyromegaly.  She reports tenderness with palpation of anterior and posterior cervical nodes and tonsillar nodes.  I am unable to palpate any significantly enlarged nodes. Lungs: Clear bilaterally to auscultation without wheezes, rales, or rhonchi. Breathing is unlabored. Heart: Regular rhythm. No murmurs, rubs, or gallops. Msk:  Strength and tone normal for age. Extremities/Skin: Warm and dry.  Neuro: Alert and oriented X 3. Moves all extremities spontaneously. Gait is normal. CNII-XII grossly in tact. Psych:  Responds to questions appropriately with a normal affect.   Results for orders placed or performed in visit on 01/12/17  STREP GROUP A AG, W/REFLEX TO CULT  Result Value Ref Range   Streptococcus, Group A Screen (Direct) NONE DETECTED      ASSESSMENT AND PLAN:  36  y.o. year old female with  1. Acute pharyngitis, unspecified etiology Strep test is negative.  Will send culture. Strep test is negative but given her symptoms and exam findings we will go ahead and start antibiotic to cover for possible bacterial infection or strep. She is to take the amoxicillin as directed and complete all of it. She is to use Tylenol or Motrin to help with pain relief as well as fever.  Will also help with the body aches.  Can use lozenges and spray to help dull the pain at the sore  throat.  Follow-up if symptoms worsen significantly or do not resolve upon completion of abx.  - amoxicillin (AMOXIL) 500 MG capsule; Take 1 capsule (500 mg total) 3 (three) times daily by mouth.  Dispense: 30 capsule; Refill: 0  2. Sore throat - STREP GROUP A AG, W/REFLEX TO CULT   Signed, [address] Beth ColwichDixon, [place], [facility] 01/12/2017 4:41 PM

## 2017-01-15 LAB — CULTURE, GROUP A STREP
MICRO NUMBER:: 81252483
SPECIMEN QUALITY: ADEQUATE

## 2017-01-15 LAB — STREP GROUP A AG, W/REFLEX TO CULT: Streptococcus, Group A Screen (Direct): NOT DETECTED

## 2018-10-24 ENCOUNTER — Other Ambulatory Visit: Payer: Self-pay

## 2018-10-24 DIAGNOSIS — Z20822 Contact with and (suspected) exposure to covid-19: Secondary | ICD-10-CM

## 2018-10-25 LAB — NOVEL CORONAVIRUS, NAA: SARS-CoV-2, NAA: NOT DETECTED

## 2018-10-25 LAB — SPECIMEN STATUS REPORT

## 2018-10-26 ENCOUNTER — Telehealth: Payer: Self-pay | Admitting: *Deleted

## 2018-10-26 NOTE — Telephone Encounter (Signed)
Reviewed negative covid19 results with patient. No questions asked.  

## 2018-11-01 ENCOUNTER — Encounter (HOSPITAL_COMMUNITY): Payer: Self-pay

## 2018-11-01 ENCOUNTER — Ambulatory Visit (HOSPITAL_COMMUNITY)
Admission: EM | Admit: 2018-11-01 | Discharge: 2018-11-01 | Disposition: A | Discharge status: Home / Self Care | Payer: No Typology Code available for payment source

## 2018-11-01 ENCOUNTER — Other Ambulatory Visit: Payer: Self-pay

## 2018-11-01 DIAGNOSIS — R109 Unspecified abdominal pain: Secondary | ICD-10-CM | POA: Diagnosis not present

## 2018-11-01 HISTORY — DX: Depression, unspecified: F32.A

## 2018-11-01 LAB — POCT URINALYSIS DIP (DEVICE)
Bilirubin Urine: NEGATIVE
Glucose, UA: NEGATIVE mg/dL
Ketones, ur: NEGATIVE mg/dL
Leukocytes,Ua: NEGATIVE
Nitrite: NEGATIVE
Protein, ur: NEGATIVE mg/dL
Specific Gravity, Urine: 1.02 (ref 1.005–1.030)
Urobilinogen, UA: 0.2 mg/dL (ref 0.0–1.0)
pH: 8.5 — ABNORMAL HIGH (ref 5.0–8.0)

## 2018-11-01 MED ORDER — KETOROLAC TROMETHAMINE 60 MG/2ML IM SOLN
INTRAMUSCULAR | Status: AC
  Filled 2018-11-01: qty 2

## 2018-11-01 MED ORDER — KETOROLAC TROMETHAMINE 60 MG/2ML IM SOLN
60.0000 mg | Freq: Once | INTRAMUSCULAR | Status: AC
  Administered 2018-11-01: 60 mg via INTRAMUSCULAR

## 2018-11-01 MED ORDER — TAMSULOSIN HCL 0.4 MG PO CAPS: 0.4000 mg | ORAL_CAPSULE | Freq: Every day | ORAL | 0 refills | Status: AC

## 2018-11-01 MED ORDER — NAPROXEN 500 MG PO TABS: 500.0000 mg | ORAL_TABLET | Freq: Two times a day (BID) | ORAL | 0 refills | Status: AC

## 2018-11-01 NOTE — ED Triage Notes (Signed)
Patient presents to Urgent Care with complaints of left sided hip pain since about 0800 this morning. Patient reports she runs a lot and doesn't know if that has caused the pain or not. Pt took 400mg  ibuprofen and 2 gas-x thinking maybe it was trapped gas, no improvement.

## 2018-11-01 NOTE — Discharge Instructions (Signed)
Urine did have a small amount of blood which could indicate a kidney stone as a cause of your pain. We gave you a shot of Toradol prior to leaving Continue with Naprosyn twice daily with food, do not use this until later this evening Begin taking Flomax/tamsulosin daily to help pass any stone Drink plenty of fluids  Please follow-up here in the emergency room if developing persistent or worsening pain, developing fever, nausea, vomiting, worsening abdominal pain

## 2018-11-01 NOTE — ED Provider Notes (Signed)
[facility]    CSN: [id number] Arrival date & time: 11/01/18  1133      History   Chief Complaint Chief Complaint  Patient presents with  . Hip Pain    HPI Karla Mcintosh is a 38 y.o. female.   No significant past medical history presenting today for evaluation of left sided back and hip pain.  Patient states that this morning when she woke up she developed pain in her lower back that wraps around her left side into her lower abdomen.  She denies radiation into leg.  Denies numbness or tingling.  States that the pain debilitated her briefly, called EMS.  She took ibuprofen and feels the pain has eased up since.  States that she has developed a similar pain related to running, but states that this time it was a lot worse.  She typically runs on an incline for approximately 1 hour.  Notes that she ran at a steeper incline last night.  She denies any urinary symptoms of dysuria, increased frequency or urgency.  Denies hematuria.  Denies previous kidney stones.  Denies any pelvic pain or abnormal discharge.  Denies history of ovarian cysts.  She denies any changes to her bowel movements.  She has had 2 bowel movements this morning, but denies diarrhea.  She took Gas-X thinking symptoms potentially could be related to gas.  Denies nausea or vomiting.  Has not eaten or drinking since onset of pain.  Denies any fevers chills or body aches.  Denies chest pain or shortness of breath.  HPI  Past Medical History:  Diagnosis Date  . Depression     Patient Active Problem List   Diagnosis Date Noted  . Rh negative status during pregnancy 01/28/2015  . Indication for care in labor or delivery 01/26/2015  . Postpartum care following vaginal delivery (11/20) 01/26/2015    Past Surgical History:  Procedure Laterality Date  . NO PAST SURGERIES      OB History    Gravida  3   Para  3   Term  3   Preterm  0   AB  0   Living  3     SAB  0   TAB  0   Ectopic  0   Multiple   0   Live Births  3            Home Medications    Prior to Admission medications   Medication Sig Start Date End Date Taking? Authorizing Provider  ibuprofen (ADVIL,MOTRIN) 800 MG tablet Take 1 tablet (800 mg total) by mouth every 8 (eight) hours. 01/28/15  Yes Donette Larry, CNM  sertraline (ZOLOFT) 50 MG tablet Take 50 mg by mouth daily.   Yes [provider]  naproxen (NAPROSYN) 500 MG tablet Take 1 tablet (500 mg total) by mouth 2 (two) times daily. 11/01/18   Teigen Parslow C, PA-C  tamsulosin (FLOMAX) 0.4 MG CAPS capsule Take 1 capsule (0.4 mg total) by mouth daily. 11/01/18   Tova Vater, Junius Creamer, PA-C    Family History Family History  Problem Relation Age of Onset  . Healthy Mother   . Diabetes Father   . Cancer Maternal Grandmother        thyroid cancer  . Arthritis Paternal Grandmother        rheumatoid  . Heart disease Paternal Grandfather     Social History Social History   Tobacco Use  . Smoking status: Never Smoker  . Smokeless tobacco: Never  Used  Substance Use Topics  . Alcohol use: Yes    Comment: Very rare  . Drug use: No     Allergies   Patient has no known allergies.   Review of Systems Review of Systems  Constitutional: Negative for activity change, appetite change and fever.  Respiratory: Negative for shortness of breath.   Cardiovascular: Negative for chest pain.  Gastrointestinal: Positive for abdominal pain. Negative for diarrhea, nausea and vomiting.  Genitourinary: Positive for flank pain. Negative for dysuria, genital sores, hematuria, menstrual problem, vaginal bleeding, vaginal discharge and vaginal pain.  Musculoskeletal: Positive for back pain.  Skin: Negative for rash.  Neurological: Negative for dizziness, light-headedness and headaches.     Physical Exam Triage Vital Signs ED Triage Vitals  Enc Vitals Group     BP 11/01/18 1153 125/87     Pulse Rate 11/01/18 1153 72     Resp 11/01/18 1153 17     Temp  11/01/18 1153 97.7 F (36.5 C)     Temp Source 11/01/18 1153 Oral     SpO2 11/01/18 1153 100 %     Weight --      Height --      Head Circumference --      Peak Flow --      Pain Score 11/01/18 1149 6     Pain Loc --      Pain Edu? --      Excl. in [place]? --    No data found.  Updated Vital Signs BP 125/87 (BP Location: Left Arm)   Pulse 72   Temp 97.7 F (36.5 C) (Oral)   Resp 17   SpO2 100%   Visual Acuity Right Eye Distance:   Left Eye Distance:   Bilateral Distance:    Right Eye Near:   Left Eye Near:    Bilateral Near:     Physical Exam Vitals signs and nursing note reviewed.  Constitutional:      Appearance: She is well-developed.     Comments: No acute distress  HENT:     Head: Normocephalic and atraumatic.     Nose: Nose normal.  Eyes:     Conjunctiva/sclera: Conjunctivae normal.  Neck:     Musculoskeletal: Neck supple.  Cardiovascular:     Rate and Rhythm: Normal rate.  Pulmonary:     Effort: Pulmonary effort is normal. No respiratory distress.     Comments: Breathing comfortably at rest, CTABL, no wheezing, rales or other adventitious sounds auscultated  Abdominal:     General: There is no distension.     Comments: Soft, nondistended, nontender to light and deep palpation throughout entire abdomen, no focal tenderness  Musculoskeletal: Normal range of motion.     Comments: Nontender palpation of cervical, thoracic and lumbar spine midline, no reproducible tenderness to palpation with palpation of left-sided thoracic and lumbar musculature, patient indicates source of pain is within lower thoracic region/upper lumbar region.  Gait without abnormality Moving extremities appropriately  Skin:    General: Skin is warm and dry.  Neurological:     Mental Status: She is alert and oriented to person, place, and time.      UC Treatments / Results  Labs (all labs ordered are listed, but only abnormal results are displayed) Labs Reviewed - No data to  display  EKG   Radiology No results found.  Procedures Procedures (including critical care time)  Medications Ordered in UC Medications  ketorolac (TORADOL) injection 60 mg (has no administration in  time range)    Initial Impression / Assessment and Plan / UC Course  I have reviewed the triage vital signs and the nursing notes.  Pertinent labs & imaging results that were available during my care of the patient were reviewed by me and considered in my medical decision making (see chart for details).     UA with trace intact RBCs.  Do not suspect underlying abdominal emergency at this time, seems less likely GI related.  Will treat as MSK cause/possible kidney stone.  Will provide Toradol prior to discharge.  Will continue on Naprosyn.  Flomax daily, strain urine.  Continue to monitor,Discussed strict return precautions. Patient verbalized understanding and is agreeable with plan.  Final Clinical Impressions(s) / UC Diagnoses   Final diagnoses:  Left flank pain     Discharge Instructions     Urine did have a small amount of blood which could indicate a kidney stone as a cause of your pain. We gave you a shot of Toradol prior to leaving Continue with Naprosyn twice daily with food, do not use this until later this evening Begin taking Flomax/tamsulosin daily to help pass any stone Drink plenty of fluids  Please follow-up here in the emergency room if developing persistent or worsening pain, developing fever, nausea, vomiting, worsening abdominal pain    ED Prescriptions    Medication Sig Dispense Auth. Provider   naproxen (NAPROSYN) 500 MG tablet Take 1 tablet (500 mg total) by mouth 2 (two) times daily. 30 tablet Zyliah Schier C, PA-C   tamsulosin (FLOMAX) 0.4 MG CAPS capsule Take 1 capsule (0.4 mg total) by mouth daily. 10 capsule Cyera Balboni C, PA-C     Controlled Substance Prescriptions  Controlled Substance Registry consulted? Not Applicable   Lew DawesWieters,  Averly Ericson C, [place]PA-C 11/01/18 1311

## 2018-11-03 ENCOUNTER — Encounter (HOSPITAL_COMMUNITY): Payer: Self-pay | Admitting: Emergency Medicine

## 2018-11-03 ENCOUNTER — Ambulatory Visit (HOSPITAL_COMMUNITY)
Admission: EM | Admit: 2018-11-03 | Discharge: 2018-11-03 | Disposition: A | Discharge status: Home / Self Care | Payer: No Typology Code available for payment source | Attending: Family Medicine | Admitting: Family Medicine

## 2018-11-03 ENCOUNTER — Other Ambulatory Visit: Payer: Self-pay

## 2018-11-03 DIAGNOSIS — N390 Urinary tract infection, site not specified: Secondary | ICD-10-CM | POA: Diagnosis present

## 2018-11-03 DIAGNOSIS — R319 Hematuria, unspecified: Secondary | ICD-10-CM | POA: Diagnosis present

## 2018-11-03 DIAGNOSIS — Z3202 Encounter for pregnancy test, result negative: Secondary | ICD-10-CM

## 2018-11-03 LAB — POCT PREGNANCY, URINE: Preg Test, Ur: NEGATIVE

## 2018-11-03 LAB — POCT URINALYSIS DIP (DEVICE)
Glucose, UA: NEGATIVE mg/dL
Nitrite: NEGATIVE
Protein, ur: 100 mg/dL — AB
Specific Gravity, Urine: >=1.03 (ref 1.005–1.030)
Urobilinogen, UA: 1 mg/dL (ref 0.0–1.0)
pH: 5.5 (ref 5.0–8.0)

## 2018-11-03 MED ORDER — CEPHALEXIN 500 MG PO CAPS: 500.0000 mg | ORAL_CAPSULE | Freq: Four times a day (QID) | ORAL | 0 refills | Status: AC

## 2018-11-03 NOTE — ED Triage Notes (Signed)
Pt seen here 2 days ago for kidney stone which she passed now having sx of UTI

## 2018-11-03 NOTE — Discharge Instructions (Signed)
Your urine showed signs of a possible infection.  Take the antibiotic as prescribed.    A urine culture is pending and we will call you if your antibiotic needs to be changed.    Return here or follow-up with your primary care provider if you develop fever, abdominal pain, back pain, vaginal discharge, pelvic pain, or other concerning symptoms.

## 2018-11-03 NOTE — ED Provider Notes (Addendum)
[facility]    CSN: [id number] Arrival date & time: 11/03/18  1107      History   Chief Complaint Chief Complaint  Patient presents with  . Appointment    1110  . Dysuria    HPI Karla Mcintosh is a 39 y.o. female.   Patient presents with dysuria, frequency, chills x1 day.  She was seen here on 11/01/2018 and diagnosed as likely having a kidney stone which she states she passed later that same day.  She denies fever, abdominal pain, back pain, vaginal discharge, pelvic pain, or other symptoms.  LMP: end of July.   The history is provided by the patient.    Past Medical History:  Diagnosis Date  . Depression     Patient Active Problem List   Diagnosis Date Noted  . Rh negative status during pregnancy 01/28/2015  . Indication for care in labor or delivery 01/26/2015  . Postpartum care following vaginal delivery (11/20) 01/26/2015    Past Surgical History:  Procedure Laterality Date  . NO PAST SURGERIES      OB History    Gravida  3   Para  3   Term  3   Preterm  0   AB  0   Living  3     SAB  0   TAB  0   Ectopic  0   Multiple  0   Live Births  3            Home Medications    Prior to Admission medications   Medication Sig Start Date End Date Taking? Authorizing Provider  cephALEXin (KEFLEX) 500 MG capsule Take 1 capsule (500 mg total) by mouth 4 (four) times daily. 11/03/18   Mickie Bail, NP  ibuprofen (ADVIL,MOTRIN) 800 MG tablet Take 1 tablet (800 mg total) by mouth every 8 (eight) hours. 01/28/15   Donette Larry, CNM  naproxen (NAPROSYN) 500 MG tablet Take 1 tablet (500 mg total) by mouth 2 (two) times daily. 11/01/18   Wieters, Hallie C, PA-C  sertraline (ZOLOFT) 50 MG tablet Take 50 mg by mouth daily.    [provider]  tamsulosin (FLOMAX) 0.4 MG CAPS capsule Take 1 capsule (0.4 mg total) by mouth daily. 11/01/18   Wieters, Junius Creamer, PA-C    Family History Family History  Problem Relation Age of Onset  .  Healthy Mother   . Diabetes Father   . Cancer Maternal Grandmother        thyroid cancer  . Arthritis Paternal Grandmother        rheumatoid  . Heart disease Paternal Grandfather     Social History Social History   Tobacco Use  . Smoking status: Never Smoker  . Smokeless tobacco: Never Used  Substance Use Topics  . Alcohol use: Yes    Comment: Very rare  . Drug use: No     Allergies   Patient has no known allergies.   Review of Systems Review of Systems  Constitutional: Positive for chills. Negative for fever.  HENT: Negative for ear pain and sore throat.   Eyes: Negative for pain and visual disturbance.  Respiratory: Negative for cough and shortness of breath.   Cardiovascular: Negative for chest pain and palpitations.  Gastrointestinal: Negative for abdominal pain, diarrhea and vomiting.  Genitourinary: Positive for dysuria and frequency. Negative for flank pain, hematuria, pelvic pain and vaginal discharge.  Musculoskeletal: Negative for arthralgias and back pain.  Skin: Negative for color change and  rash.  Neurological: Negative for seizures and syncope.  All other systems reviewed and are negative.    Physical Exam Triage Vital Signs ED Triage Vitals  Enc Vitals Group     BP 11/03/18 1138 122/85     Pulse Rate 11/03/18 1138 99     Resp 11/03/18 1138 18     Temp 11/03/18 1138 98.9 F (37.2 C)     Temp Source 11/03/18 1138 Oral     SpO2 11/03/18 1138 100 %     Weight --      Height --      Head Circumference --      Peak Flow --      Pain Score 11/03/18 1139 6     Pain Loc --      Pain Edu? --      Excl. in GC? --    No data found.  Updated Vital Signs BP 122/85 (BP Location: Right Arm)   Pulse 99   Temp 98.9 F (37.2 C) (Oral)   Resp 18   SpO2 100%   Visual Acuity Right Eye Distance:   Left Eye Distance:   Bilateral Distance:    Right Eye Near:   Left Eye Near:    Bilateral Near:     Physical Exam Vitals signs and nursing note  reviewed.  Constitutional:      General: She is not in acute distress.    Appearance: She is well-developed.  HENT:     Head: Normocephalic and atraumatic.     Mouth/Throat:     Mouth: Mucous membranes are moist.     Pharynx: Oropharynx is clear.  Eyes:     Conjunctiva/sclera: Conjunctivae normal.  Neck:     Musculoskeletal: Neck supple.  Cardiovascular:     Rate and Rhythm: Normal rate and regular rhythm.     Heart sounds: No murmur.  Pulmonary:     Effort: Pulmonary effort is normal. No respiratory distress.     Breath sounds: Normal breath sounds.  Abdominal:     General: Bowel sounds are normal.     Palpations: Abdomen is soft.     Tenderness: There is no abdominal tenderness. There is no right CVA tenderness, left CVA tenderness, guarding or rebound.  Skin:    General: Skin is warm and dry.  Neurological:     Mental Status: She is alert.      UC Treatments / Results  Labs (all labs ordered are listed, but only abnormal results are displayed) Labs Reviewed  POCT URINALYSIS DIP (DEVICE) - Abnormal; Notable for the following components:      Result Value   Bilirubin Urine SMALL (*)    Ketones, ur TRACE (*)    Hgb urine dipstick SMALL (*)    Protein, ur 100 (*)    Leukocytes,Ua TRACE (*)    All other components within normal limits  URINE CULTURE  POC URINE PREG, ED  POCT PREGNANCY, URINE    EKG   Radiology No results found.  Procedures Procedures (including critical care time)  Medications Ordered in UC Medications - No data to display  Initial Impression / Assessment and Plan / UC Course  I have reviewed the triage vital signs and the nursing notes.  Pertinent labs & imaging results that were available during my care of the patient were reviewed by me and considered in my medical decision making (see chart for details).    Urinary tract infection.  Urine pregnancy negative.  Urine dip positive  for trace LE, protein, small blood, trace ketones,  small bili.  Urine culture pending.  Treating with cephalexin.  Instructed patient to return here or follow-up with her PCP if she develops other symptoms such as fever, abdominal pain, back pain, vaginal discharge, pelvic pain, or other concerning symptoms.  Patient agrees with treatment plan.   Final Clinical Impressions(s) / UC Diagnoses   Final diagnoses:  Urinary tract infection with hematuria, site unspecified     Discharge Instructions     Your urine showed signs of a possible infection.  Take the antibiotic as prescribed.    A urine culture is pending and we will call you if your antibiotic needs to be changed.    Return here or follow-up with your primary care provider if you develop fever, abdominal pain, back pain, vaginal discharge, pelvic pain, or other concerning symptoms.        ED Prescriptions    Medication Sig Dispense Auth. Provider   cephALEXin (KEFLEX) 500 MG capsule Take 1 capsule (500 mg total) by mouth 4 (four) times daily. 20 capsule Sharion Balloon, NP     Controlled Substance Prescriptions [place] Controlled Substance Registry consulted? Not Applicable   Sharion Balloon, NP 11/03/18 1222    Sharion Balloon, NP 11/03/18 1226

## 2018-11-05 LAB — URINE CULTURE: Culture: >=100000 — AB

## 2018-11-07 ENCOUNTER — Telehealth (HOSPITAL_COMMUNITY): Payer: Self-pay | Admitting: Emergency Medicine

## 2018-11-07 NOTE — Telephone Encounter (Signed)
Urine culture was positive for e coli and was given keflex at urgent care visit. Pt contacted and made aware, educated on completing antibiotic and to follow up if symptoms are persistent. Verbalized understanding.
